# Patient Record
Sex: Female | Born: 1990 | Hispanic: Yes | Marital: Married | Smoking: Never smoker
Health system: Southern US, Community
[De-identification: ages and names within clinical notes are randomized; demographics above are authoritative.]

## PROBLEM LIST (undated history)

## (undated) DIAGNOSIS — G43909 Migraine, unspecified, not intractable, without status migrainosus: Secondary | ICD-10-CM

## (undated) DIAGNOSIS — O24419 Gestational diabetes mellitus in pregnancy, unspecified control: Secondary | ICD-10-CM

## (undated) DIAGNOSIS — E282 Polycystic ovarian syndrome: Secondary | ICD-10-CM

## (undated) HISTORY — DX: Gestational diabetes mellitus in pregnancy, unspecified control: O24.419

## (undated) HISTORY — DX: Polycystic ovarian syndrome: E28.2

## (undated) HISTORY — DX: Migraine, unspecified, not intractable, without status migrainosus: G43.909

## (undated) HISTORY — PX: NO PAST SURGERIES: SHX2092

---

## 2016-07-12 ENCOUNTER — Ambulatory Visit (INDEPENDENT_AMBULATORY_CARE_PROVIDER_SITE_OTHER): Payer: Self-pay | Admitting: Emergency Medicine

## 2016-07-12 VITALS — BP 130/85 | HR 63 | Temp 98.3°F | Resp 18 | Wt 150.4 lb

## 2016-07-12 DIAGNOSIS — N946 Dysmenorrhea, unspecified: Secondary | ICD-10-CM

## 2016-07-12 DIAGNOSIS — N938 Other specified abnormal uterine and vaginal bleeding: Secondary | ICD-10-CM

## 2016-07-12 DIAGNOSIS — N979 Female infertility, unspecified: Secondary | ICD-10-CM

## 2016-07-12 NOTE — Patient Instructions (Addendum)
IF you received an x-ray today, you will receive an invoice from [facility]. Please contact [facility] at [phone] with questions or concerns regarding your invoice.   IF you received labwork today, you will receive an invoice from [place]. Please contact [facility] at [phone] with questions or concerns regarding your invoice.   Our billing staff will not be able to assist you with questions regarding bills from these companies.  You will be contacted with the lab results as soon as they are available. The fastest way to get your results is to activate your My Chart account. Instructions are located on the last page of this paperwork. If you have not heard from [place] regarding the results in 2 weeks, please contact this office.      Metrorragia funcional (Dysfunctional Uterine Bleeding) La metrorragia funcional es una hemorragia anormal proveniente del tero. La metrorragia funcional incluye estos sntomas:  Menstruacin que se adelanta o se atrasa.  Menstruacin menos o ms abundante, o con cogulos sanguneos.  Hemorragias entre los perodos [employer].  Ausencia de una o ms menstruaciones.  Hemorragias luego de Sales promotion account executive.  Sangrado luego de la menopausia. INSTRUCCIONES PARA EL CUIDADO EN EL HOGAR Est atenta a cualquier cambio en los sntomas. Estas indicaciones pueden ayudarla con el trastorno: Comidas  Siga una dieta equilibrada. Incluya alimentos con [employer] de hierro, como hgado, carne, Oceanographer, verduras de hoja verde y [place].  Si tiene estreimiento:  Beba abundante agua.  Consuma frutas y verduras con alto contenido de agua y [place], [place] espinaca, zanahorias, frambuesas, manzanas y mango. Medicamentos  [employer] de venta libre y los recetados solamente como se lo haya indicado el mdico.  No haga cambios en los medicamentos sin hablar con el mdico.  La aspirina o los medicamentos que la  contienen pueden aumentar la hemorragia. No tome esos medicamentos:  Durante la semana previa a Tax adviser.  Durante la [employer].  Si le recetaron comprimidos de hierro, Scientist, forensic se lo haya indicado el mdico. Estos ayudan a Restaurant manager, fast food hierro que el organismo pierde debido a este trastorno. Actividad  Si debe cambiarse el apsito o el tampn ms de una vez cada 2horas:  Acustese con los pies elevados.  Colquese una compresa fra en la parte baja del abdomen.  Haga todo el reposo que pueda hasta que la hemorragia se detenga o disminuya.  No trate de Management consultant que la hemorragia se detenga y los niveles de hierro en la sangre se normalicen. Otras indicaciones  [employer], anote lo siguiente:  La fecha de comienzo de Tax adviser.  La fecha de su finalizacin.  Los [facility] que tiene una hemorragia anormal.  Los problemas que advierte.  Concurra a todas las visitas de control como se lo haya indicado el mdico. Esto es importante. SOLICITE ATENCIN MDICA SI:  Se siente dbil o que va a desvanecerse.  Tiene nuseas y vmitos.  No puede comer ni beber sin vomitar.  Tiene mareos o diarrea mientras toma los medicamentos.  Est tomando anticonceptivos u hormonas, y desea cambiar o suspender estos medicamentos. SOLICITE ATENCIN MDICA DE INMEDIATO SI:  Tiene escalofros o fiebre.  Debe cambiarse el apsito o el tampn ms de una vez por hora.  La hemorragia se vuelve ms abundante o el flujo menstrual contiene cogulos con ms frecuencia.  Siente dolor en el abdomen.  Pierde la conciencia.  Le aparece una erupcin cutnea. Esta informacin no tiene Theme park manager  el consejo del mdico. Asegrese de hacerle al mdico cualquier pregunta que tenga. Document Released: 12/30/2004 Document Revised: 12/11/2014 Document Reviewed: 06/17/2014 Elsevier Interactive Patient Education  2017 [employer].

## 2016-07-12 NOTE — Progress Notes (Signed)
Adriana Hahn 26 y.o.   Chief Complaint  Patient presents with  . Menstrual Problem    spotting, cramps     HISTORY OF PRESENT ILLNESS: This is a 26 y.o. female complaining of several years h/o irregular menses and infertility. Has seen GYNMD in [place]; had hormone replacement therapy without results. Looking for advise.  HPI   Prior to Admission medications   Not on File    No Known Allergies  Patient Active Problem List   Diagnosis Date Noted  . DUB (dysfunctional uterine bleeding) 07/12/2016  . Menstrual cramps 07/12/2016  . Infertility, female 07/12/2016    No past medical history on file.  No past surgical history on file.  Social History   Social History  . Marital status: Married    Spouse name: N/A  . Number of children: N/A  . Years of education: N/A   Occupational History  . Not on file.   Social History Main Topics  . Smoking status: Never Smoker  . Smokeless tobacco: Never Used  . Alcohol use No  . Drug use: Unknown  . Sexual activity: Not on file   Other Topics Concern  . Not on file   Social History Narrative  . No narrative on file    No family history on file.   Review of Systems  Constitutional: Negative.  Negative for chills, fever and weight loss.  HENT: Negative.   Eyes: Negative.   Respiratory: Negative.   Cardiovascular: Negative.   Gastrointestinal: Negative.  Negative for abdominal pain, diarrhea, nausea and vomiting.  Genitourinary: Negative for dysuria, flank pain, hematuria and urgency.  Musculoskeletal: Negative.   Skin: Negative.  Negative for rash.  Neurological: Negative.   Endo/Heme/Allergies: Negative.  Does not bruise/bleed easily.  All other systems reviewed and are negative.  Vitals:   07/12/16 1640  BP: 130/85  Pulse: 63  Resp: 18  Temp: 98.3 F (36.8 C)     Physical Exam  Constitutional: She appears well-developed and well-nourished.  HENT:  Head: Normocephalic and atraumatic.  Nose: Nose  normal.  Mouth/Throat: Oropharynx is clear and moist. No oropharyngeal exudate.  Eyes: Conjunctivae and EOM are normal. Pupils are equal, round, and reactive to light.  Neck: Normal range of motion. Neck supple. No JVD present. No thyromegaly present.  Cardiovascular: Normal rate and regular rhythm.   Pulmonary/Chest: Effort normal and breath sounds normal.  Abdominal: Soft. Bowel sounds are normal. She exhibits no distension. There is no tenderness.  Musculoskeletal: Normal range of motion.  Lymphadenopathy:    She has no cervical adenopathy.  Neurological: She is alert. No sensory deficit. She exhibits normal muscle tone.  Skin: Skin is warm and dry.  Psychiatric: She has a normal mood and affect. Her behavior is normal.  Vitals reviewed.    ASSESSMENT & PLAN: Tocara was seen today for menstrual problem.  Diagnoses and all orders for this visit:  DUB (dysfunctional uterine bleeding) -     Ambulatory referral to Obstetrics / Gynecology  Menstrual cramps -     Ambulatory referral to Obstetrics / Gynecology  Infertility, female -     Ambulatory referral to Obstetrics / Gynecology    Patient Instructions       IF you received an x-ray today, you will receive an invoice from [facility]. Please contact [facility]iology at [phone] with questions or concerns regarding your invoice.   IF you received labwork today, you will receive an invoice from [place]. Please contact [facility] at [phone] with questions  or concerns regarding your invoice.   Our billing staff will not be able to assist you with questions regarding bills from these companies.  You will be contacted with the lab results as soon as they are available. The fastest way to get your results is to activate your My Chart account. Instructions are located on the last page of this paperwork. If you have not heard from [place] regarding the results in 2 weeks, please contact this office.       Metrorragia funcional (Dysfunctional Uterine Bleeding) La metrorragia funcional es una hemorragia anormal proveniente del tero. La metrorragia funcional incluye estos sntomas:  Menstruacin que se adelanta o se atrasa.  Menstruacin menos o ms abundante, o con cogulos sanguneos.  Hemorragias entre los perodos [employer].  Ausencia de una o ms menstruaciones.  Hemorragias luego de Sales promotion account executive.  Sangrado luego de la menopausia. INSTRUCCIONES PARA EL CUIDADO EN EL HOGAR Est atenta a cualquier cambio en los sntomas. Estas indicaciones pueden ayudarla con el trastorno: Comidas  Siga una dieta equilibrada. Incluya alimentos con [employer] de hierro, como hgado, carne, Oceanographer, verduras de hoja verde y [place].  Si tiene estreimiento:  Beba abundante agua.  Consuma frutas y verduras con alto contenido de agua y [employer], [place] espinaca, zanahorias, frambuesas, manzanas y mango. Medicamentos  [employer] de venta libre y los recetados solamente como se lo haya indicado el mdico.  No haga cambios en los medicamentos sin hablar con el mdico.  La aspirina o los medicamentos que la contienen pueden aumentar la hemorragia. No tome esos medicamentos:  Durante la semana previa a Tax adviser.  Durante la [employer].  Si le recetaron comprimidos de hierro, Scientist, forensic se lo haya indicado el mdico. Estos ayudan a Restaurant manager, fast food hierro que el organismo pierde debido a este trastorno. Actividad  Si debe cambiarse el apsito o el tampn ms de una vez cada 2horas:  Acustese con los pies elevados.  Colquese una compresa fra en la parte baja del abdomen.  Haga todo el reposo que pueda hasta que la hemorragia se detenga o disminuya.  No trate de Management consultant que la hemorragia se detenga y los niveles de hierro en la sangre se normalicen. Otras indicaciones  [employer], anote lo siguiente:  La fecha de comienzo de Barrister's clerk.  La fecha de su finalizacin.  Los [facility] que tiene una hemorragia anormal.  Los problemas que advierte.  Concurra a todas las visitas de control como se lo haya indicado el mdico. Esto es importante. SOLICITE ATENCIN MDICA SI:  Se siente dbil o que va a desvanecerse.  Tiene nuseas y vmitos.  No puede comer ni beber sin vomitar.  Tiene mareos o diarrea mientras toma los medicamentos.  Est tomando anticonceptivos u hormonas, y desea cambiar o suspender estos medicamentos. SOLICITE ATENCIN MDICA DE INMEDIATO SI:  Tiene escalofros o fiebre.  Debe cambiarse el apsito o el tampn ms de una vez por hora.  La hemorragia se vuelve ms abundante o el flujo menstrual contiene cogulos con ms frecuencia.  Siente dolor en el abdomen.  Pierde la conciencia.  Le aparece una erupcin cutnea. Esta informacin no tiene Theme park manager el consejo del mdico. Asegrese de hacerle al mdico cualquier pregunta que tenga. Document Released: 12/30/2004 Document Revised: 12/11/2014 Document Reviewed: 06/17/2014 Elsevier Interactive Patient Education  2017 Elsevier Inc.      Edwina Barth, MD [facility]

## 2016-07-27 ENCOUNTER — Ambulatory Visit (INDEPENDENT_AMBULATORY_CARE_PROVIDER_SITE_OTHER): Payer: Self-pay | Admitting: Gynecology

## 2016-07-27 ENCOUNTER — Encounter: Payer: Self-pay | Admitting: Gynecology

## 2016-07-27 VITALS — BP 112/76 | Ht 61.0 in | Wt 152.8 lb

## 2016-07-27 DIAGNOSIS — N970 Female infertility associated with anovulation: Secondary | ICD-10-CM

## 2016-07-27 DIAGNOSIS — Z01419 Encounter for gynecological examination (general) (routine) without abnormal findings: Secondary | ICD-10-CM

## 2016-07-27 LAB — CBC WITH DIFFERENTIAL/PLATELET
BASOS ABS: 83 {cells}/uL (ref 0–200)
Basophils Relative: 1 %
EOS PCT: 10 %
Eosinophils Absolute: 830 cells/uL — ABNORMAL HIGH (ref 15–500)
HCT: 37.4 % (ref 35.0–45.0)
Hemoglobin: 12.4 g/dL (ref 11.7–15.5)
LYMPHS PCT: 33 %
Lymphs Abs: 2739 cells/uL (ref 850–3900)
MCH: 28.8 pg (ref 27.0–33.0)
MCHC: 33.2 g/dL (ref 32.0–36.0)
MCV: 87 fL (ref 80.0–100.0)
MONOS PCT: 5 %
MPV: 10.9 fL (ref 7.5–12.5)
Monocytes Absolute: 415 cells/uL (ref 200–950)
NEUTROS PCT: 51 %
Neutro Abs: 4233 cells/uL (ref 1500–7800)
PLATELETS: 311 10*3/uL (ref 140–400)
RBC: 4.3 MIL/uL (ref 3.80–5.10)
RDW: 14.5 % (ref 11.0–15.0)
WBC: 8.3 10*3/uL (ref 3.8–10.8)

## 2016-07-27 LAB — TSH: TSH: 0.71 m[IU]/L

## 2016-07-27 NOTE — Patient Instructions (Signed)
Fertilizacin in vitro  (In Vitro Fertilization) La fertilizacin in vitro (FIV) es un tipo de tecnologa aplicada a la reproduccin asistida. La FIV consiste en una serie de procedimientos para tratar la infertilidad o problemas genticos con el objeto de prestar asistencia para la concepcin de un beb. Durante la FIV, los vulos se recuperan de los ovarios y se combinan con los espermatozoides en el laboratorio para fertilizarlos. Uno o ms vulos fertilizados (embriones) se insertan en el tero a travs del cuello uterino. Las candidatas para la FIV son:  Personas que sufren infertilidad.  Las mujeres que sufren una menopausia prematura o una falla ovrica.  Las mujeres a las que se les han extirpado ambos ovarios. En este caso, deber usarse una donante de vulos.  Mujeres que tienen daadas u obstruidas las trompas de [place] No hay lmite de edad para la FIV, pero no se recomienda para las mujeres postmenopusicas. La edad ideal para este procedimiento es de 35 aos o menos. A las mujeres de ms de 41 aos generalmente se les aconseja utilizar una donante de vulos durante la FIV para aumentar las probabilidades de xito. INFORME A SU MDICO:   Cualquier alergia que tenga.  Todos los medicamentos que utiliza, incluyendo vitaminas, hierbas, gotas oftlmicas, cremas y medicamentos de venta libre.  Problemas previos que usted o los miembros de su familia hayan tenido con el uso de anestsicos.  Todo problema mdico o gentico que usted o los miembros de su familia hayan sufrido.  Enfermedades de la sangre.  Cirugas previas.  Embarazos previos.  Padecimientos mdicos.  Excesos en el consumo de alcohol o de tabaco.  Historia de consumo de drogas. RIESGOS Y COMPLICACIONES  Generalmente, el procedimiento de FIV es un procedimiento seguro. Sin embargo, como en cualquier procedimiento, pueden surgir complicaciones. Las complicaciones posibles son:  Sangrado o  infeccin.  Problemas con la anestesia.  Cogulos sanguneos.  El procedimiento no tiene xito.  Tener gemelos o embarazos mltiples  Aumento del riesgo de parto prematuro. ANTES DEL PROCEDIMIENTO  Antes de comenzar el ciclo de FIV, usted y el donante sern sometidos a estudios para asegurarse de que la FIV es la mejor opcin. Algunas personas pueden no beneficiarse con este tipo de tecnologa para la asistencia reproductiva.   Usted y el donante tendrn que proporcionar una historia clnica completa y la historia clnica de sus familiares.  Usted y el donante sern sometidos a un examen fsico.  Usted y el donante podrn necesitar realizarse anlisis de sangre para detectar enfermedades infecciosas, incluyendo el VIH.  Podrn solicitarle otros estudios, por ejemplo:  Estudio de los ovarios para determinar la calidad y la cantidad de vulos.  Estudios hormonales y de ovulacin.  Un examen del tero. Este se realiza por medio de un tipo de ecografa, despus de inyectar un lquido en el tero a travs del cuello uterino (ecohisterografa o utilizando un tubo delgado y flexible, con una pequea luz y cmara en un extremo (histeroscopio).  La esperma del donante ser tomada y analizada para ver si es normal, hay cantidad suficiente para fertilizar el vulo y que actan normalmente despus de las relaciones sexuales (examen postcoital ). PROCEDIMIENTO  La FIV consiste en varios pasos. Estos procedimientos pueden realizarse en el consultorio mdico o en una clnica. Un ciclo de FIV puede llevar alrededor de 2 semanas, y podr requerirse ms de un ciclo. Los pasos de la FIV son:  Estimulacin ovrica. Si se utilizan sus vulos durante la FIV, al comienzo del ciclo   comenzar un tratamiento con hormonas artificiales (sintticas. Estas hormonas estimulan los ovarios para producir mltiples vulos, a diferencia del vulo nico que normalmente se desarrolla cada mes. Es necesario obtener  mltiples vulos debido a que algunos de ellos no se fertilizarn o no se desarrollarn normalmente despus de la fertilizacin.  Extraccin de vulos. Utilizando imgenes radiogrficas como gua, el medico insertar una aguja fina a travs de la vagina y la dirigir a los ovarios y sacos (folculos) que contienen los vulos La aguja se conecta a un dispositivo de succin, que retira los vulos y el lquido de cada folculo, uno por vez. El procedimiento se repite para el otro ovario.  Inseminacin y fertilizacin. El esperma se une a los ovarios (inseminacin) y se almacena en una cmara que tiene un ambiente controlado. Generalmente el esperma ingresa (fertiliza) el vulo unas horas despus de la inseminacin.  Transferencia embrionaria. El mdico colocar los embriones en su tero usando un tubo delgado (catter) que contiene los embriones. El catter se inserta en la vagina a travs del cuello uterino, y de all al tero. La transferencia de vulos generalmente ocurre entre 2 a 6 das luego de la extraccin de los vulos. Si hay xito, el embrin se adherir (implante) en la superficie interna del tero alrededor de 6 a 10 das luego de la extraccin de los vulos. Si se implanta el embrin en la membrana que cubre el tero y se desarrolla, resulta en un embarazo. DESPUS DEL PROCEDIMIENTO   Tendr que permanecer acostada durante una hora o ms, antes de volver a su casa.  Ser necesario que siga con la terapia hormonal por 3 meses aproximadamente, o segn lo que le indique su mdico.  Puede retomar su dieta y las actividades habituales. Esta informacin no tiene como fin reemplazar el consejo del mdico. Asegrese de hacerle al mdico cualquier pregunta que tenga. Document Released: 11/22/2012 Elsevier Interactive Patient Education  2017 Elsevier Inc.  

## 2016-07-27 NOTE — Progress Notes (Signed)
Patient ID: Adriana Hahn, female   DOB: [birth year], 26 y.o.   MRN: [id number]     Adriana Hahn 26-Jun-1990 [id number]   History:    26 y.o.  for annual gyn exam  who is a new patient to the practice. She is a gravida 0 para 0 with complaining of primary infertility and skipping menstrual cycles attributed chronic anovulation. Patient stated that last year she was evaluated in [place] had an HSG and told her tubes were blocked and some form of office procedure was performed and she was informed that her tubes were open she was placed on clomiphene citrate for which she took for 8 months 1 tablet a 50 mg for 5 days a month and did not conceive. She stated that she only did the ovulation predictor kit for 2 out of those months. She did not have a menstrual cycle in January and February and then in March she bled from the third through the 15th. She denies any nipple discharge or any unusual headaches or blurry vision but she does suffer from migraine headaches. She denies any past history of any STDs or pelvic surgery and last Pap smear was over 2 years ago in [place] was reportedly normal. Her husband is 74 years of age and hasn't 71 year old from a different partner. Patient stated for many years in the past her gynecologist in [place] and given her Provera to take every 30 days and she did not have a spontaneous menses.   Past medical history,surgical history, family history and social history were all reviewed and documented in the EPIC chart.  Gynecologic History Patient's last menstrual period was 07/06/2016. Contraception: none Last Pap:  over 2 years ago. Results were: normal Last mammogram:  not indicated. Results were: not indicated   Obstetric History OB History  Gravida Para Term Preterm AB Living  0 0 0 0 0 0  SAB TAB Ectopic Multiple Live Births  0 0 0 0 0         ROS: A ROS was performed and pertinent positives and negatives are included in the history.  GENERAL: No fevers or chills.  HEENT: No change in vision, no earache, sore throat or sinus congestion. NECK: No pain or stiffness. CARDIOVASCULAR: No chest pain or pressure. No palpitations. PULMONARY: No shortness of breath, cough or wheeze. GASTROINTESTINAL: No abdominal pain, nausea, vomiting or diarrhea, melena or bright red blood per rectum. GENITOURINARY: No urinary frequency, urgency, hesitancy or dysuria. MUSCULOSKELETAL: No joint or muscle pain, no back pain, no recent trauma. DERMATOLOGIC: No rash, no itching, no lesions. ENDOCRINE: No polyuria, polydipsia, no heat or cold intolerance. No recent change in weight. HEMATOLOGICAL: No anemia or easy bruising or bleeding. NEUROLOGIC: No headache, seizures, numbness, tingling or weakness. PSYCHIATRIC: No depression, no loss of interest in normal activity or change in sleep pattern.     Exam: chaperone present  BP 112/76   Ht 5' 1"  (1.549 m)   Wt 152 lb 12.8 oz (69.3 kg)   LMP 07/06/2016   BMI 28.87 kg/m   Body mass index is 28.87 kg/m.  General appearance : Well developed well nourished female. No acute distress HEENT: Eyes: no retinal hemorrhage or exudates,  Neck supple, trachea midline, no carotid bruits, no thyroidmegaly Lungs: Clear to auscultation, no rhonchi or wheezes, or rib retractions  Heart: Regular rate and rhythm, no murmurs or gallops Breast:Examined in sitting and supine position were symmetrical in appearance, no palpable masses or tenderness,  no skin retraction, no nipple  inversion, no nipple discharge, no skin discoloration, no axillary or supraclavicular lymphadenopathy Abdomen: no palpable masses or tenderness, no rebound or guarding Extremities: no edema or skin discoloration or tenderness  Pelvic:  Bartholin, Urethra, Skene Glands: Within normal limits             Vagina: No gross lesions or discharge  Cervix: No gross lesions or discharge  Uterus   anteverted, normal size, shape and consistency, non-tender and mobile  Adnexa  Without  masses or tenderness  Anus and perineum  normal   Rectovaginal  normal sphincter tone without palpated masses or tenderness             HeHad indicated     Assessment/Plan:  26 y.o. female for annual exam with history of primary infertility, chronic anovulation on clear of workup done in [place] in for this reason with the start of her next cycle she will call the office we'll schedule an HSG to determine tubal patency before we moved further. She'll be prescribed Vibramycin 100 mg to take twice a day for 3 days starting the day before the HSG. Patient will be instructed to take a prenatal vitamins daily. I've given a prescription for Provera 10 mg take 1 by mouth daily for 10 days if she does not have a menses by the fifth of next month before she calls to schedule the HSG. Also going to check a TSH and prolactin today. If her tubes are indeed blocked we discussed possible referral to reproductive endocrinologist for in vitro fertilization for which surgery information was provided in [place].   Terrance Mass MD, 3:02 PM 07/27/2016

## 2016-07-28 LAB — PAP IG W/ RFLX HPV ASCU

## 2016-07-28 LAB — PROLACTIN: Prolactin: 6.3 ng/mL

## 2016-08-02 ENCOUNTER — Other Ambulatory Visit: Payer: Self-pay | Admitting: Gynecology

## 2016-08-02 DIAGNOSIS — R898 Other abnormal findings in specimens from other organs, systems and tissues: Secondary | ICD-10-CM

## 2016-08-02 DIAGNOSIS — D721 Eosinophilia: Principal | ICD-10-CM

## 2016-08-10 ENCOUNTER — Telehealth: Payer: Self-pay | Admitting: *Deleted

## 2016-08-10 NOTE — Telephone Encounter (Signed)
Pharmacy called regarding written Rx from OV 07/27/16. Pharmacist was unable to read the directions for provera 10 mg. I re-read what the note said which was "Provera 10 mg take 1 by mouth daily for 10 days if she does not have a menses by the fifth of next month"

## 2016-08-18 ENCOUNTER — Encounter: Payer: Self-pay | Admitting: Gynecology

## 2016-08-23 ENCOUNTER — Telehealth: Payer: Self-pay | Admitting: *Deleted

## 2016-08-23 DIAGNOSIS — N970 Female infertility associated with anovulation: Secondary | ICD-10-CM

## 2016-08-23 NOTE — Telephone Encounter (Signed)
-----   Message from Jerilynn Mageslaudia Shaffer sent at 08/23/2016  9:11 AM EDT ----- Regarding: nurse call Patient needs HSG. LMP 08-21-16 she prefers afternoon appointments. Thx

## 2016-08-23 NOTE — Telephone Encounter (Signed)
I called women's to schedule and based on pt LMP she will need to wait until next cycle. Day 10 would be a holiday and radiology department is closed for procedures. Debarah CrapeClaudia will relay to patient.

## 2016-09-06 ENCOUNTER — Other Ambulatory Visit: Payer: Self-pay | Admitting: Gynecology

## 2016-09-29 ENCOUNTER — Telehealth: Payer: Self-pay | Admitting: *Deleted

## 2016-09-29 DIAGNOSIS — N970 Female infertility associated with anovulation: Secondary | ICD-10-CM

## 2016-09-29 MED ORDER — DOXYCYCLINE HYCLATE 100 MG PO CAPS: ORAL_CAPSULE | ORAL | 0 refills | Status: DC

## 2016-09-29 NOTE — Telephone Encounter (Signed)
Appointment on 10/04/16 @ 2:00pm/ check [facility] at 1:45pm  [facility] $[facility] same day. Rx will be sent for Vibramycin 100 twice daily starting day before procedure. Will route to claudia to relay.

## 2016-09-29 NOTE — Telephone Encounter (Signed)
-----   Message from Jerilynn Mageslaudia Shaffer sent at 09/28/2016 12:39 PM EDT ----- Regarding: HSG Please scheduled HSG her LMP 09-26-16 and she prefers afternoon. She is self pay and I already informed her of the cost. Thx

## 2016-09-29 NOTE — Telephone Encounter (Signed)
Detailed message left

## 2016-10-04 ENCOUNTER — Ambulatory Visit (HOSPITAL_COMMUNITY): Admission: RE | Admit: 2016-10-04 | Payer: Self-pay | Source: Ambulatory Visit

## 2016-10-05 ENCOUNTER — Encounter (HOSPITAL_COMMUNITY): Payer: Self-pay | Admitting: Radiology

## 2016-10-05 ENCOUNTER — Ambulatory Visit (HOSPITAL_COMMUNITY)
Admission: RE | Admit: 2016-10-05 | Discharge: 2016-10-05 | Disposition: A | Discharge status: Home / Self Care | Payer: Self-pay | Source: Ambulatory Visit | Attending: Gynecology | Admitting: Gynecology

## 2016-10-05 DIAGNOSIS — N970 Female infertility associated with anovulation: Secondary | ICD-10-CM | POA: Insufficient documentation

## 2016-10-05 MED ORDER — IOPAMIDOL (ISOVUE-300) INJECTION 61%
30.0000 mL | Freq: Once | INTRAVENOUS | Status: AC | PRN
Start: 2016-10-05 — End: 2016-10-05
  Administered 2016-10-05: 5 mL

## 2016-10-14 ENCOUNTER — Encounter: Payer: Self-pay | Admitting: Gynecology

## 2016-10-14 ENCOUNTER — Ambulatory Visit (INDEPENDENT_AMBULATORY_CARE_PROVIDER_SITE_OTHER): Payer: Self-pay | Admitting: Gynecology

## 2016-10-14 VITALS — BP 138/80

## 2016-10-14 DIAGNOSIS — N970 Female infertility associated with anovulation: Secondary | ICD-10-CM

## 2016-10-14 MED ORDER — LETROZOLE 2.5 MG PO TABS: ORAL_TABLET | ORAL | 2 refills | Status: DC

## 2016-10-14 NOTE — Patient Instructions (Addendum)
IInfertilidad (Infertility) La infertilidad es no poder Research scientist (physical sciences)lograr el embarazo (concebir) despus de un ao de Pharmacologistmantener relaciones sexuales regularmente sin usar ningn mtodo de control de la natalidad. La infertilidad tambin puede significar que una mujer no puede llevar el embarazo a trmino. Tanto hombres como mujeres pueden tener problemas de fertilidad. CULES SON LAS CAUSAS DE LA INFERTILIDAD? Cules son las causas de la infertilidad en las mujeres? Hay muchas causas posibles de infertilidad en las mujeres. En algunas mujeres, no hay ninguna causa aparente de infertilidad (infertilidad idioptica). La infertilidad tambin puede estar vinculada a ms de [place] causa. Los problemas de infertilidad en las mujeres pueden deberse a problemas en el ciclo menstrual o los rganos reproductivos, ciertas afecciones mdicas y factores relacionados con la edad y el estilo de vida.  Los problemas en el ciclo menstrual pueden interferir con los ovarios que producen los vulos (ovulacin). Esto puede causar dificultad para quedar embarazada e incluye tener un ciclo menstrual muy largo, muy corto o irregular.  Los problemas relacionados con los rganos reproductivos incluyen lo siguiente: ? Un cuello de tero anormalmente angosto o que no permanece cerrado durante el [place]. ? Una obstruccin en las trompas de [place]. ? Un tero de forma anormal. ? Fibromas uterinos. Estos son masas de tejido (tumores) que pueden Solicitordesarrollarse en el tero.  Las afecciones mdicas que pueden afectar la fertilidad en las mujeres incluyen lo siguiente: ? Sndrome de ovario poliqustico (SOP). Este es un trastorno hormonal que produce pequeos quistes en los ovarios y es la causa ms frecuente de infertilidad en las mujeres. ? Endometriosis. Es una enfermedad en la que el tejido que recubre el tero (endometrio) crece fuera de su ubicacin normal. ? Insuficiencia ovrica primaria. Esto es cuando los ovarios dejan de producir  vulos y hormonas antes de los 40aos de [place]. ? Enfermedades de transmisin sexual (ETS), como la clamidia o la gonorrea. Estas infecciones pueden provocar fibrosis en las trompas de Falopio, lo que disminuye la probabilidad de que los vulos lleguen al tero. ? Trastornos autoinmunitarios. Estas son afecciones en las que el sistema inmunitario ataca las clulas normales y saludables. ? Desequilibrios hormonales.  Otros factores incluyen los siguientes: ? La edad. La fertilidad en las mujeres declina con la edad, especialmente despus de los 35aos. ? Tener bajo peso o sobrepeso. ? Beber alcohol en exceso. ? Consumir drogas. ? Hacer ejercicio en exceso. ? La exposicin a toxinas ambientales, como la radiacin, los plaguicidas y ciertos qumicos. Cules son las causas de la infertilidad en los hombres? Hay muchas causas de infertilidad en los hombres. La infertilidad puede estar vinculada a ms de Dean Foods Companyuna causa. Los problemas de infertilidad en los hombres pueden deberse a problemas con los espermatozoides o los rganos reproductivos, ciertas afecciones mdicas y factores relacionados con la edad y el estilo de vida. Algunos hombres tienen infertilidad idioptica.  Problemas con los espermatozoides. La infertilidad puede ser consecuencia de un problema para producir lo siguiente: ? Suficientes espermatozoides (bajo recuento de espermatozoides). ? Suficientes espermatozoides con forma normal (morfologa de los espermatozoides). ? Espermatozoides que puedan llegar al vulo (motilidad deficiente).  Las causas de la infertilidad tambin incluyen las siguientes: ? Un problema con las hormonas. ? Venas agrandadas (varicocele), quistes (espermatocele) o tumores en los testculos. ? Disfuncin sexual. ? Una lesin en los testculos. ? Un defecto de nacimiento, como no tener los conductos que transportan los espermatozoides (conductos deferentes).  Algunas de las afecciones mdicas que pueden  afectar la fertilidad en los hombres son  las siguientes: ? Diabetes. ? Facilities manager, como la radioterapia o la quimioterapia. ? El sndrome de Klinefelter. Este es un trastorno gentico hereditario. ? Problemas de tiroides, como una tiroides hipoactiva o hiperactiva. ? Fibrosis qustica. ? Enfermedades de transmisin sexual.  Otros factores incluyen los siguientes: ? La edad. La fertilidad en los hombres declina con la edad. ? Beber alcohol en exceso. ? Consumir drogas. ? La exposicin a toxinas ambientales, como la radiacin, los plaguicidas y el plomo. CULES SON LOS SNTOMAS DE LA INFERTILIDAD? El nico signo de infertilidad es no poder Retail buyer despus de un ao de Theatre manager relaciones sexuales regularmente sin usar ningn mtodo de control de la natalidad. CMO SE DIAGNOSTICA LA INFERTILIDAD? Para recibir un diagnstico de infertilidad, ambos integrantes de la pareja se harn a un examen fsico. Tambin se analizar en detalle la historia clnica y sexual de ambos. Si no hay una razn evidente de infertilidad, se harn estudios adicionales. Qu estudios se harn las mujeres? Las mujeres primero pueden hacerse estudios para controlar si ovulan todos los meses. Estos estudios pueden incluir lo siguiente:  Anlisis de sangre para [employer] niveles hormonales.  Una ecografa de los ovarios. Este estudio detecta posibles problemas en los ovarios.  Toma de [place] de tejido que recubre el tero para examinarla en el microscopio (biopsia de endometrio). Las mujeres que ovulan pueden realizarse estudios adicionales. Estos pueden incluir los siguientes:  Histerosalpingografa. ? Es una radiografa de las trompas de [place] y el tero despus de Tour manager un tipo especfico de sustancia de [place]. ? Esta prueba puede mostrar la forma del tero y si las trompas de [place] estn [place].  Laparoscopia. ? En [employer], se [place] un tubo que  emite luz (laparoscopio) para Education officer, environmental en las trompas de Falopio y otros rganos femeninos.  Ecografa transvaginal. ? Este es un estudio de diagnstico por imgenes que determina si hay anomalas en el tero y los ovarios. ? El mdico puede utilizar este estudio para contar la cantidad de [employer] ovarios.  Histeroscopa. ? En [employer], se [place] un tubo que emite luz para examinar el cuello y el interior del tero. ? Se realiza para Airline pilot en el interior del tero. Qu estudios se harn los hombres? Los estudios para Office manager infertilidad en los hombres incluyen los siguientes:  Anlisis de semen para controlar el recuento, la morfologa y la motilidad de los espermatozoides.  Anlisis de sangre para [employer].  Toma de [place] de tejido del interior de un testculo (biopsia). La muestra se examina en el microscopio.  Anlisis de sangre para Airline pilot genticas (pruebas genticas). CUL ES EL TRATAMIENTO PARA LA INFERTILIDAD EN LAS MUJERES? El tratamiento depende de la causa de la infertilidad. En la [employer], la infertilidad en las mujeres se trata con medicamentos o [place].  Las mujeres pueden tomar medicamentos para: ? Biomedical scientist de ovulacin. ? [employer], como el SOP.  Se puede realizar Clementeen Hoof para lo siguiente: ? Reparar daos en los ovarios, las trompas de [place], el cuello del tero o el tero. ? Extraer tumores del tero. ? Extraer tejido cicatricial del tero, la pelvis u otros rganos femeninos. CUL ES EL TRATAMIENTO PARA LA INFERTILIDAD EN LOS [place]? El tratamiento depende de la causa de la infertilidad. En la [employer], la infertilidad en los hombres se trata con medicamentos o [place].  Los hombres pueden tomar  medicamentos para: ? Corregir problemas hormonales. ? Tratar otras enfermedades. ? Tratar la disfuncin sexual.  Se  puede realizar una ciruga para lo siguiente: ? Eliminar obstrucciones en el tracto reproductivo. ? Corregir otros problemas estructurales del tracto reproductivo. QU SON LAS TCNICAS DE REPRODUCCIN ASISTIDA? Las tcnicas de reproduccin asistida (TRA) se refieren a todos los tratamientos y procedimientos que unen vulos y espermatozoides fuera del cuerpo para ayudar a una pareja a concebir. Las TRA se suelen combinar con medicamentos para la fertilidad que estimulan la ovulacin. En algunos casos, las TRA se llevan a cabo con vulos extrados del cuerpo de otra mujer (vulos de donante) o con vulos previamente fertilizados y congelados (embriones). Hay diferentes tipos de TRA. Estos incluyen los siguientes:  Inseminacin intrauterina (IIU). ? En este procedimiento, los espermatozoides se colocan directamente en el tero de la mujer con un tubo largo y delgado. ? Esta tcnica puede ser la ms efectiva para la infertilidad causada por problemas con los espermatozoides, incluidos el bajo recuento y la baja motilidad. ? Se puede utilizar en combinacin con medicamentos para la fertilidad.  Fertilizacin in vitro (FIV). ? Por lo general, se utiliza esta tcnica cuando las trompas de [place] de la mujer estn obstruidas o si el hombre tiene un bajo recuento de espermatozoides. ? Los medicamentos para la infertilidad estimulan los ovarios para que produzcan varios vulos. Cuando estn maduros, estos vulos se extraen del cuerpo y se unen a los espermatozoides para ser fertilizados. ? Luego los vulos fertilizados se colocan en el tero de la mujer. Esta informacin no tiene como fin reemplazar el consejo del mdico. Asegrese de hacerle al mdico cualquier pregunta que tenga. Document Released: 04/11/2007 Document Revised: 04/12/2014 Document Reviewed: 12/05/2013 Elsevier Interactive Patient Education  2018 Elsevier Inc.  

## 2016-10-14 NOTE — Progress Notes (Signed)
   Patient is a 26 year old gravida 0 who was seen for the first time as a new patient in the office on April 24 of this year for annual exam. Patient states that time she was skip her cycles for several months. Patient stated that last year she was evaluated in [place] had an HSG and told her tubes were blocked and some form of office procedure was performed and she was informed that her tubes were open she was placed on clomiphene citrate for several months and did not conceive. She stated that she use the ovulation predictor kit 2 out of the 3 months. We had done a TSH and prolactin here in the office which was normal.Her husband is 68 years of age and hasn't 92 year old from a different partner. Patient stated for many years in the past her gynecologist in [place] and given her Provera to take every 30 days and she did not have a spontaneous menses. She had been scheduled to have an HSG and return to the office for consultation. She did state that her husband has had a semen analysis in [place] in January this year. Her recent HSG demonstrated the following:  FINDINGS: Endometrial Cavity: Normal appearance. No signs of Mullerian duct anomaly or other significant abnormality.  Right Fallopian Tube: Normal appearance. Free intraperitoneal spill of contrast is demonstrated.  Left Fallopian Tube: Normal appearance. Free intraperitoneal spill of contrast is demonstrated.  Other:  None.  IMPRESSION: Normal study. Both fallopian tubes are patent.  She reports her last menstrual cycle was June 21 of this year. We had a discussion of given her a trial of letrozole 2.5 mg from day 3-7 of her cycle with the timing of intercourse by using the ovulation predictor kit from day 10 through 16. If she does not conceive in 3 months or return back to the office for further evaluation or possible referral to reproductive endocrinologist but first I would recommend that her husband and have first a semen analysis  here in the [place]. We do not have the report from [place]. She is agreeable to this. The risks benefits and pros and cons of ovulation induction medication were discussed to include hyperstimulation and multiple gestation. Literature information was provided in [place]. I've recommended also that she begin taking a prenatal vitamin daily.

## 2017-05-03 ENCOUNTER — Encounter: Payer: Self-pay | Admitting: Obstetrics & Gynecology

## 2017-05-03 ENCOUNTER — Ambulatory Visit (INDEPENDENT_AMBULATORY_CARE_PROVIDER_SITE_OTHER): Payer: Self-pay | Admitting: Obstetrics & Gynecology

## 2017-05-03 VITALS — BP 118/78

## 2017-05-03 DIAGNOSIS — N644 Mastodynia: Secondary | ICD-10-CM

## 2017-05-03 DIAGNOSIS — N970 Female infertility associated with anovulation: Secondary | ICD-10-CM

## 2017-05-03 MED ORDER — CLOMIPHENE CITRATE 50 MG PO TABS: 50.0000 mg | ORAL_TABLET | Freq: Every day | ORAL | 0 refills | Status: DC

## 2017-05-03 NOTE — Progress Notes (Signed)
    Adriana Hahn 15-Jan-1991 [id number]        27 y.o.  G0 Married.  Presents with husband.   RP: Irregular periods with bilateral breast tenderness x 2 months  HPI: H/O Anovulatory infertility.  Tried Clomid x 3 cycles last year, last cycle was ovulatory per patient.  HSG normal 10/2016 with tubes patent bilaterally and normal uterine cavity.  Per husband, [place]perm [place] normal.  LMP 04/15/2017.  Last 2 periods were regular at 1 month interval, but very light flow x 4 days.  Patient reports doing an Ovulatory test that was positive for many consecutive days in January.  Complains of bilateral breast tenderness that has remained the same for the last 2 months.  In the past, the tenderness was resolving after her periods.  No breast lump or nipple d/c. Some pelvic discomfort at times, but no current pain.  No pain with IC.  No abnormal vaginal d/c.  Urine and BMs wnl.   OB History  Gravida Para Term Preterm AB Living  0 0 0 0 0 0  SAB TAB Ectopic Multiple Live Births  0 0 0 0 0        Past medical history,surgical history, problem list, medications, allergies, family history and social history were all reviewed and documented in the EPIC chart.   Directed ROS with pertinent positives and negatives documented in the history of present illness/assessment and plan.  Exam:  Vitals:   05/03/17 1109  BP: 118/78   General appearance:  Normal  Breasts:  Right and Left with no nodule or mass.  Bilaterally tender centrally with mildly fibrocystic tissue felt.  Normal bilateral nipples.  No axillary LN felt bilaterally.  Abdomen:  Soft, NT, not distended  Gynecologic exam:  Vulva normal.  Bimanual exam:  Uterus RV, normal volume, mobile, NT.  No adnexal mass felt.   Assessment/Plan:  27 y.o. G0P0000   1. Breast pain in female Normal bilateral breast exam.  Bilateral breast tenderness probably associated with the combination of anovulatory cycles and mild fibrocystic breasts.   Patient reassured.  Starting stimulation with Clomiphen, which will change the balance in estrogen/progesterone and will observe the impact on the breast symptoms.  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  Patient did ovulatory tests and the results have been inconsistent.  Probably not ovulating every month.  History of primary anovulatory infertility which responded to Clomid in the past.  Had tried Clomid for 3 months and the last month was ovulatory per patient.  Decision to re-start on Clomid-5 mg/tab. 1 tablet daily from day 3 to 7 of the next cycle.  Patient will then come for an ultrasound follicle study around day 10 of the period.  Will give Ovidrel injection.  Patient and husband will decide if prefer natural intercourse after that or intrauterine insemination with sperm wash. - US Transvaginal Non-OB; Future  Other orders - clomiPHENE (CLOMID) 50 MG tablet; Take 1 tablet (50 mg total) by mouth daily. Take 1 tablet daily from day 3 to day 7 of the cycle.  Counseling on above issues >50% x 25 minutes.  Genia DelMarie-Lyne Terran Hollenkamp MD, 11:15 AM 05/03/2017

## 2017-05-03 NOTE — Patient Instructions (Signed)
1. Breast pain in female Normal bilateral breast exam.  Bilateral breast tenderness probably associated with the combination of anovulatory cycles and mild fibrocystic breasts.  Patient reassured.  Starting stimulation with Clomiphen, which will change the balance in estrogen/progesterone and will observe the impact on the breast symptoms.  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  Patient did ovulatory tests and the results have been inconsistent.  Probably not ovulating every month.  History of primary anovulatory infertility which responded to Clomid in the past.  Had tried Clomid for 3 months and the last month was ovulatory per patient.  Decision to re-start on Clomid-5 mg/tab. 1 tablet daily from day 3 to 7 of the next cycle.  Patient will then come for an ultrasound follicle study around day 10 of the period.  Will give Ovidrel injection.  Patient and husband will decide if prefer natural intercourse after that or intrauterine insemination with sperm wash. - US Transvaginal Non-OB; Future  Other orders - clomiPHENE (CLOMID) 50 MG tablet; Take 1 tablet (50 mg total) by mouth daily. Take 1 tablet daily from day 3 to day 7 of the cycle.  Aundrea, fue un placer conocerle hoy!  Voy a verle de nuevo con un Ultrasonido el proximo mes.

## 2017-05-16 ENCOUNTER — Ambulatory Visit: Payer: Self-pay | Admitting: Obstetrics & Gynecology

## 2017-05-16 ENCOUNTER — Other Ambulatory Visit: Payer: Self-pay

## 2017-05-31 ENCOUNTER — Telehealth: Payer: Self-pay | Admitting: *Deleted

## 2017-05-31 MED ORDER — CHORIOGONADOTROPIN ALFA 250 MCG/0.5ML ~~LOC~~ INJ: 250.0000 ug | INJECTION | Freq: Once | SUBCUTANEOUS | 0 refills | Status: AC

## 2017-05-31 NOTE — Telephone Encounter (Signed)
Pt called Started period 05/28/17. Will do Follicle study per Dr Sharol RousselLavoie's note around D10 of cycle. Therefore Debarah CrapeClaudia will call and schedule on March 4th.  Ovidrel will be sent to the patient's pharmacy. KW CMA

## 2017-05-31 NOTE — Telephone Encounter (Signed)
Rx called in since did not go to pharmacy electronically. KW CMA

## 2017-06-06 ENCOUNTER — Encounter: Payer: Self-pay | Admitting: Obstetrics & Gynecology

## 2017-06-06 ENCOUNTER — Ambulatory Visit (INDEPENDENT_AMBULATORY_CARE_PROVIDER_SITE_OTHER): Payer: Self-pay

## 2017-06-06 ENCOUNTER — Ambulatory Visit (INDEPENDENT_AMBULATORY_CARE_PROVIDER_SITE_OTHER): Payer: Self-pay | Admitting: Obstetrics & Gynecology

## 2017-06-06 VITALS — BP 116/78

## 2017-06-06 DIAGNOSIS — N970 Female infertility associated with anovulation: Secondary | ICD-10-CM

## 2017-06-06 NOTE — Progress Notes (Signed)
    Adriana CheshireFlor Hahn [birth year] [id number]        27 y.o.  G0P0000   Adriana Hahn: Primary anovulatory infertility for pelvic ultrasound/follicle study  HPI: Cycle on Clomid 50 mg/tab. 1 tablet daily from day third 2/7 of the cycle.  Last menstrual period normal starting on May 27, 2017.  On day 10 of the cycle today.   OB History  Gravida Para Term Preterm AB Living  0 0 0 0 0 0  SAB TAB Ectopic Multiple Live Births  0 0 0 0 0        Past medical history,surgical history, problem list, medications, allergies, family history and social history were all reviewed and documented in the EPIC chart.   Directed ROS with pertinent positives and negatives documented in the history of present illness/assessment and plan.  Exam:  Vitals:   06/06/17 1633  BP: 116/78   General appearance:  Normal  Pelvic US today: T/V images.  Uterus anteverted and homogeneous measuring 6.43 x 5.47 x 3.9 cm.  Endometrial lining tri-layered normal at 8.6 mm.  Right ovary with 25 follicles all less than 10 mm.  Left ovary with 5 follicles are less than 10 mm.  No apparent mass in the right or left adnexa.   Assessment/Plan:  27 y.o. G0P0000   1. Primary anovulatory infertility Day #10 of a Clomid 50 mg cycle.  Multiple small follicles, but no dominant follicle yet.  Will repeat ultrasound follicle study in 3 days on day 13 of the cycle.  If no dominant follicle by then, will plan on doubling the Clomid dosage next cycle.  - US Transvaginal Non-OB; Future  Counseling on above issues more than 50% for 15 minutes.  Genia DelMarie-Lyne Mahrukh Seguin MD, 4:37 PM 06/06/2017

## 2017-06-06 NOTE — Patient Instructions (Signed)
1. Primary anovulatory infertility Day #10 of a Clomid 50 mg cycle.  Multiple small follicles, but no dominant follicle yet.  Will repeat ultrasound follicle study in 3 days on day 13 of the cycle.  If no dominant follicle by then, will plan on doubling the Clomid dosage next cycle.  - US Transvaginal Non-OB; Future  Chioma, fue un placer verle hoy!

## 2017-06-09 ENCOUNTER — Ambulatory Visit (INDEPENDENT_AMBULATORY_CARE_PROVIDER_SITE_OTHER): Payer: Self-pay | Admitting: Obstetrics & Gynecology

## 2017-06-09 ENCOUNTER — Ambulatory Visit (INDEPENDENT_AMBULATORY_CARE_PROVIDER_SITE_OTHER): Payer: Self-pay

## 2017-06-09 ENCOUNTER — Encounter: Payer: Self-pay | Admitting: Obstetrics & Gynecology

## 2017-06-09 VITALS — BP 120/80

## 2017-06-09 DIAGNOSIS — N970 Female infertility associated with anovulation: Secondary | ICD-10-CM

## 2017-06-09 MED ORDER — CLOMIPHENE CITRATE 50 MG PO TABS: 100.0000 mg | ORAL_TABLET | Freq: Every day | ORAL | 0 refills | Status: AC

## 2017-06-09 NOTE — Progress Notes (Signed)
    Adriana Hahn 1990-06-23 [id number]        27 y.o.  G0P0000  Married   RP: US follicle study  HPI: Day # 13 of cycle with Clomid 50 mg day # 3-7th.     OB History  Gravida Para Term Preterm AB Living  0 0 0 0 0 0  SAB TAB Ectopic Multiple Live Births  0 0 0 0 0        Past medical history,surgical history, problem list, medications, allergies, family history and social history were all reviewed and documented in the EPIC chart.   Directed ROS with pertinent positives and negatives documented in the history of present illness/assessment and plan.  Exam:  Vitals:   06/09/17 1602  BP: 120/80   General appearance:  Normal  Pelvic US today: T/V images.  Retroverted uterus with tri-layered endometrium.  Endometrial lining measures 12.3 mm.  Right ovary with 31 follicles less or equal to 10 mm.  Left ovary with 13 follicles less or equal to 10 mm.  Small amount of fluid in the posterior cul-de-sac measuring 2.0 x 1.6 cm.   Assessment/Plan:  27 y.o. G0P0000   1. Primary anovulatory infertility No dominant follicle.  Probably anovulatory cycle.  Will increase Clomid to 100 mg day 3-7th next cycle.  If no spontaneous period, will take Provera after a neg HPT.  Clomid 100 mg #10 prescription sent to pharmacy.  Counseling on treatment to overcome anovulation.  Will start with increasing Clomid, but if not successful, may try Femara and if still no success, will refer to Fertility specialist for Hyperstimulation/IVF.  Other orders - clomiPHENE (CLOMID) 50 MG tablet; Take 2 tablets (100 mg total) by mouth daily for 5 days. From day 3 to day 7 of the cycle.  Counseling on above issues >50% x 15 minutes.  Adriana DelMarie-Lyne Rainn Bullinger MD, 4:23 PM 06/09/2017

## 2017-06-09 NOTE — Patient Instructions (Signed)
1. Primary anovulatory infertility No dominant follicle.  Probably anovulatory cycle.  Will increase Clomid to 100 mg day 3-7th next cycle.  If no spontaneous period, will take Provera after a neg HPT.  Clomid 100 mg #10 prescription sent to pharmacy.  Counseling on treatment to overcome anovulation.  Will start with increasing Clomid, but if not successful, may try Femara and if still no success, will refer to Fertility specialist for Hyperstimulation/IVF.  Other orders - clomiPHENE (CLOMID) 50 MG tablet; Take 2 tablets (100 mg total) by mouth daily for 5 days. From day 3 to day 7 of the cycle.  Rhena, un placer verle hoy!

## 2017-07-14 ENCOUNTER — Other Ambulatory Visit: Payer: Self-pay | Admitting: *Deleted

## 2017-07-14 DIAGNOSIS — N970 Female infertility associated with anovulation: Secondary | ICD-10-CM

## 2017-07-20 ENCOUNTER — Ambulatory Visit (INDEPENDENT_AMBULATORY_CARE_PROVIDER_SITE_OTHER): Payer: Self-pay | Admitting: Obstetrics & Gynecology

## 2017-07-20 ENCOUNTER — Encounter: Payer: Self-pay | Admitting: Obstetrics & Gynecology

## 2017-07-20 ENCOUNTER — Ambulatory Visit (INDEPENDENT_AMBULATORY_CARE_PROVIDER_SITE_OTHER): Payer: Self-pay

## 2017-07-20 VITALS — BP 130/84

## 2017-07-20 DIAGNOSIS — N970 Female infertility associated with anovulation: Secondary | ICD-10-CM

## 2017-07-20 MED ORDER — CHORIOGONADOTROPIN ALFA 250 MCG/0.5ML ~~LOC~~ INJ
250.0000 ug | INJECTION | Freq: Once | SUBCUTANEOUS | Status: AC
  Administered 2017-07-20: 0.25 mg via SUBCUTANEOUS

## 2017-07-20 NOTE — Progress Notes (Signed)
    Adriana Hahn 1990-05-13 [id number]        27 y.o.  G0P0000   RP: Day# 11 of a Clomid cycle for Adriana Hahn follicles  HPI: Clomid 100 mg day 3-7th.  No pelvic pain.     OB History  Gravida Para Term Preterm AB Living  0 0 0 0 0 0  SAB TAB Ectopic Multiple Live Births  0 0 0 0 0    Past medical history,surgical history, problem list, medications, allergies, family history and social history were all reviewed and documented in the EPIC chart.   Directed ROS with pertinent positives and negatives documented in the history of present illness/assessment and plan.  Exam:  Vitals:   07/20/17 1257  BP: 130/84   General appearance:  Normal  Pelvic Adriana Hahn today: T/V images.  Retroverted uterus with tri-layered endometrium measuring 11.9 mm.  Right ovary with 19 follicles in the 10 mm and less range.  Left ovary with a dominant 17.9 mm follicle.  Mild fluid adjacent to the left ovary.  No free fluid in the posterior cul-de-sac.   Assessment/Plan:  27 y.o. G0P0000   1. Primary anovulatory infertility Primary anovulatory infertility responding to Clomid 100 mg with a dominant follicle on the left ovary.  Ovidrel injection today.  Offered intrauterine insemination with sperm wash, but couple prefers natural intercourse for this cycle.  Timed intercourse discussed with husband and patient.  Other orders - Choriogonadotropin Alfa 0.25 mg  Counseling on above issues and coordination of care more than 50% for 15 minutes.  Adriana DelMarie-Lyne [name] MD, 1:08 PM 07/20/2017

## 2017-07-21 ENCOUNTER — Encounter: Payer: Self-pay | Admitting: Obstetrics & Gynecology

## 2017-07-21 NOTE — Patient Instructions (Signed)
1. Primary anovulatory infertility Primary anovulatory infertility responding to Clomid 100 mg with a dominant follicle on the left ovary.  Ovidrel injection today.  Offered intrauterine insemination with sperm wash, but couple prefers natural intercourse for this cycle.  Timed intercourse discussed with husband and patient.  Other orders - Choriogonadotropin Alfa 0.25 mg  Steve, good seeing you today!

## 2017-08-08 ENCOUNTER — Other Ambulatory Visit: Payer: Self-pay | Admitting: Obstetrics & Gynecology

## 2017-08-08 ENCOUNTER — Telehealth: Payer: Self-pay | Admitting: *Deleted

## 2017-08-08 DIAGNOSIS — N8300 Follicular cyst of ovary, unspecified side: Secondary | ICD-10-CM

## 2017-08-08 MED ORDER — LETROZOLE 2.5 MG PO TABS: ORAL_TABLET | ORAL | 0 refills | Status: DC

## 2017-08-08 NOTE — Telephone Encounter (Signed)
Patient informed new RX sent to her pharmacy.

## 2017-08-08 NOTE — Telephone Encounter (Signed)
Agree with Femara 2.5 mg BID day 3-7th.

## 2017-08-08 NOTE — Telephone Encounter (Signed)
Rx sent, Adriana Hahn will you let patient know.

## 2017-08-08 NOTE — Telephone Encounter (Signed)
Adriana Hahn spoke with patient, she called stating her cycle started on 08/07/15 and would like Rx for Femara, based on 06/09/17 note if no success on clomid Rx would be sent in for this. Please advise

## 2017-08-15 ENCOUNTER — Ambulatory Visit (INDEPENDENT_AMBULATORY_CARE_PROVIDER_SITE_OTHER): Payer: Self-pay | Admitting: Obstetrics & Gynecology

## 2017-08-15 ENCOUNTER — Ambulatory Visit (INDEPENDENT_AMBULATORY_CARE_PROVIDER_SITE_OTHER): Payer: Self-pay

## 2017-08-15 DIAGNOSIS — N970 Female infertility associated with anovulation: Secondary | ICD-10-CM

## 2017-08-15 DIAGNOSIS — N8300 Follicular cyst of ovary, unspecified side: Secondary | ICD-10-CM

## 2017-08-15 NOTE — Progress Notes (Signed)
    Adriana Hahn 1990/08/01 [id number]        27 y.o.  G0 Married  RP: Day # 11 Femara cycle for Pelvic US follicles  HPI: LMP 08/05/2017.  No pelvic pain.  No BTB.     OB History  Gravida Para Term Preterm AB Living  0 0 0 0 0 0  SAB TAB Ectopic Multiple Live Births  0 0 0 0 0    Past medical history,surgical history, problem list, medications, allergies, family history and social history were all reviewed and documented in the EPIC chart.   Directed ROS with pertinent positives and negatives documented in the history of present illness/assessment and plan.  Exam:  There were no vitals filed for this visit. General appearance:  Normal  Pelvic US today: T/V images.  Retroverted uterus with normal trial layered endometrium measuring 6.6 mm.  Right ovary with many small follicles and a dominant follicle measuring 16.2 mm.  Left ovary with many small follicles.  No free fluid in the posterior cul-de-sac.   Assessment/Plan:  27 y.o. G0P0000   1. Primary anovulatory infertility Probable dominant follicle on the Rt ovary.  Repeat US follicle in 2 days.  If follicle is adequate, will give Ovidrel.  Then, IUI sperm wash a day later. - US Transvaginal Non-OB; Future  Counseling on above issues and coordination of care >50% x 15 minutes  [name], 4:11 PM 08/15/2017

## 2017-08-16 ENCOUNTER — Telehealth: Payer: Self-pay | Admitting: *Deleted

## 2017-08-16 MED ORDER — CHORIOGONADOTROPIN ALFA 250 MCG/0.5ML ~~LOC~~ INJ: 250.0000 ug | INJECTION | Freq: Once | SUBCUTANEOUS | 0 refills | Status: AC

## 2017-08-16 MED ORDER — CHORIOGONADOTROPIN ALFA 250 MCG/0.5ML ~~LOC~~ INJ: 250.0000 ug | INJECTION | Freq: Once | SUBCUTANEOUS | 0 refills | Status: DC

## 2017-08-16 NOTE — Telephone Encounter (Signed)
Rx has been sent  

## 2017-08-16 NOTE — Telephone Encounter (Signed)
Rx called in, since medication can not be sent via epic.

## 2017-08-16 NOTE — Telephone Encounter (Signed)
-----   Message from [name], MD sent at 08/15/2017  4:25 PM EDT ----- Regarding: Ovidrel injection Please send a prescription for Ovidrel injection.  Will need it on 08/17/2017.

## 2017-08-16 NOTE — Telephone Encounter (Signed)
Dr.Lavoie the medication is pending for you to sign and verify the directions and the dose and any refills if needed. I am not familiar with medication. Please advise

## 2017-08-16 NOTE — Telephone Encounter (Signed)
Per Dr Seymour Bars patient to have sperm washing on Thursday morning and insemination her no later than 10:00 am..   I called and left message with Dolphus Jenny at [phone] [facility] to call me to find out the process to schedule the sperm washing.

## 2017-08-17 ENCOUNTER — Ambulatory Visit (INDEPENDENT_AMBULATORY_CARE_PROVIDER_SITE_OTHER): Payer: Self-pay

## 2017-08-17 ENCOUNTER — Ambulatory Visit (INDEPENDENT_AMBULATORY_CARE_PROVIDER_SITE_OTHER): Payer: Self-pay | Admitting: Obstetrics & Gynecology

## 2017-08-17 ENCOUNTER — Encounter: Payer: Self-pay | Admitting: Obstetrics & Gynecology

## 2017-08-17 DIAGNOSIS — N970 Female infertility associated with anovulation: Secondary | ICD-10-CM

## 2017-08-17 NOTE — Patient Instructions (Signed)
1. Primary anovulatory infertility Probable dominant follicle on the Rt ovary.  Repeat US follicle in 2 days.  If follicle is adequate, will give Ovidrel.  Then, IUI sperm wash a day later. - US Transvaginal Non-OB; Future  Aspin, fue un placer verle hoy!

## 2017-08-17 NOTE — Telephone Encounter (Signed)
Spoke Aida Puffer and pt is scheduled on 08/18/17 @ 10:00am at the [place] location [address] the cost for the sperm washing will be $200 and both need photo ID cards. The will give them a sperm kit once arriving at the office.   Dr. Dellis Filbert wanted me to check with  Dr.Fontaine to see if he would do the insemination and he said that would be fine.   Rosemarie Ax will and relay to patient.

## 2017-08-17 NOTE — Progress Notes (Signed)
    Adriana Hahn [birth year] [id number]        27 y.o.  G0P0000 Married  RP: Primary anovulatory infertility for f/u Pelvic US follicle study  HPI: Day #13 of Femara cycle.  No BTB.  No pelvic pain.   OB History  Gravida Para Term Preterm AB Living  0 0 0 0 0 0  SAB TAB Ectopic Multiple Live Births  0 0 0 0 0    Past medical history,surgical history, problem list, medications, allergies, family history and social history were all reviewed and documented in the EPIC chart.   Directed ROS with pertinent positives and negatives documented in the history of present illness/assessment and plan.  Exam:  There were no vitals filed for this visit. General appearance:  Normal  Pelvic ultrasound today: Uterus retroverted with tri-layered endometrium.  Endometrial lining measured at 12.4 mm.  Right ovary with a dominant follicle measured at 20.7 mm.  Many follicles less than 12 mm on the right ovary.  Many follicles less than 8 mm on the left ovary. No free fluid in the posterior cul-de-sac.   Assessment/Plan:  27 y.o. G0P0000   1. Primary anovulatory infertility Dominant follicle on the right ovary measuring 20.7 mm.  Ovidrel injection today.  We will follow-up tomorrow for IUI with sperm wash.  Patient understands and agrees with plan.  Counseling on above issues and coordination of care more than 50% for 15 minutes.  Adriana Del MD, 5:10 PM 08/17/2017

## 2017-08-18 ENCOUNTER — Ambulatory Visit: Payer: Self-pay | Admitting: Obstetrics & Gynecology

## 2017-08-18 ENCOUNTER — Ambulatory Visit (INDEPENDENT_AMBULATORY_CARE_PROVIDER_SITE_OTHER): Payer: Self-pay | Admitting: Gynecology

## 2017-08-18 ENCOUNTER — Encounter: Payer: Self-pay | Admitting: Gynecology

## 2017-08-18 ENCOUNTER — Encounter: Payer: Self-pay | Admitting: Obstetrics & Gynecology

## 2017-08-18 VITALS — BP 116/72

## 2017-08-18 DIAGNOSIS — N970 Female infertility associated with anovulation: Secondary | ICD-10-CM

## 2017-08-18 NOTE — Telephone Encounter (Signed)
Late entry) order was faxed to sperm washing as well to [phone]

## 2017-08-18 NOTE — Patient Instructions (Signed)
1. Primary anovulatory infertility Dominant follicle on the right ovary measuring 20.7 mm.  Ovidrel injection today.  We will follow-up tomorrow for IUI with sperm wash.  Patient understands and agrees with plan.  Sherlynn, un placer verle hoy!

## 2017-08-18 NOTE — Progress Notes (Signed)
    Adriana Hahn 1990/08/16 [id number]        27 y.o.  G0P0000 presents for IUI as arranged by Dr Seymour Bars.  Past medical history,surgical history, problem list, medications, allergies, family history and social history were all reviewed and documented in the EPIC chart.  Directed ROS with pertinent positives and negatives documented in the history of present illness/assessment and plan.  Exam: Kennon Portela assistant Vitals:   08/18/17 1146  BP: 116/72   General appearance:  Normal Abdomen soft nontender without masses guarding rebound Pelvic external BUS vagina normal.  Cervix normal.  Uterus normal size midline mobile nontender.  Adnexa without masses or tenderness  Procedure: IUI was performed using the specimen provided by the husband without difficulty.  Assessment/Plan:  27 y.o. G0P0000 with successful IUI.  Will follow up at the end of this cycle as arranged.    Dara Lords MD, 11:55 AM 08/18/2017

## 2017-08-18 NOTE — Telephone Encounter (Signed)
Adriana Hahn informed patient with all the below.  

## 2017-08-18 NOTE — Patient Instructions (Signed)
Follow-up in 2 weeks

## 2017-09-05 ENCOUNTER — Telehealth: Payer: Self-pay | Admitting: *Deleted

## 2017-09-05 NOTE — Telephone Encounter (Signed)
-----   Message from Jerilynn Mageslaudia Shaffer sent at 09/05/2017  2:05 PM EDT ----- Regarding: nurse call On March 7 note Dr Seymour BarsLavoie [facility] if not successful than refer to infertility specialist. Patient did Femara last month and did not conceive patient started her cycle. Patient wants to know if ML will do another cycle here or refer her out?

## 2017-09-05 NOTE — Telephone Encounter (Signed)
Dr.Lavoie see below note, refer to Dr.Yalcinkya office?

## 2017-09-05 NOTE — Telephone Encounter (Signed)
Yes, refer to Dr Jamse ArnYalcinkya

## 2017-09-06 NOTE — Telephone Encounter (Signed)
Referral faxed they will call to schedule

## 2017-10-04 NOTE — Telephone Encounter (Signed)
Patient had visit on 09/27/17

## 2018-08-16 ENCOUNTER — Encounter: Payer: Self-pay | Admitting: Obstetrics & Gynecology

## 2018-08-16 ENCOUNTER — Other Ambulatory Visit: Payer: Self-pay

## 2018-08-16 ENCOUNTER — Ambulatory Visit (INDEPENDENT_AMBULATORY_CARE_PROVIDER_SITE_OTHER): Payer: Self-pay | Admitting: Obstetrics & Gynecology

## 2018-08-16 VITALS — BP 126/78 | Ht 61.0 in | Wt 153.0 lb

## 2018-08-16 DIAGNOSIS — N914 Secondary oligomenorrhea: Secondary | ICD-10-CM

## 2018-08-16 DIAGNOSIS — N979 Female infertility, unspecified: Secondary | ICD-10-CM

## 2018-08-16 DIAGNOSIS — Z01419 Encounter for gynecological examination (general) (routine) without abnormal findings: Secondary | ICD-10-CM

## 2018-08-16 NOTE — Progress Notes (Signed)
Adriana Hahn 18-Jul-1990 [id number]   History:    28 y.o. G0 Married  RP:  Established patient presenting for annual gyn exam   HPI: Primary infertility, considering IVF, seen by fertility specialist already.  Skipped her menses in 07/2018.  LMP very light 08/06/2018.  No pelvic pain.  No pain with intercourse.  Normal vaginal secretions.  Urine and bowel movements normal.  Breast normal.  Body mass index 28.91.  Not exercising regularly.  Healthy nutrition.  Past medical history,surgical history, family history and social history were all reviewed and documented in the EPIC chart.  Gynecologic History Patient's last menstrual period was 08/06/2018. Contraception: none Last Pap: 07/2016. Results were: Negative Last mammogram: Never Bone Density: Never Colonoscopy: Never  Obstetric History OB History  Gravida Para Term Preterm AB Living  0 0 0 0 0 0  SAB TAB Ectopic Multiple Live Births  0 0 0 0 0     ROS: A ROS was performed and pertinent positives and negatives are included in the history.  GENERAL: No fevers or chills. HEENT: No change in vision, no earache, sore throat or sinus congestion. NECK: No pain or stiffness. CARDIOVASCULAR: No chest pain or pressure. No palpitations. PULMONARY: No shortness of breath, cough or wheeze. GASTROINTESTINAL: No abdominal pain, nausea, vomiting or diarrhea, melena or bright red blood per rectum. GENITOURINARY: No urinary frequency, urgency, hesitancy or dysuria. MUSCULOSKELETAL: No joint or muscle pain, no back pain, no recent trauma. DERMATOLOGIC: No rash, no itching, no lesions. ENDOCRINE: No polyuria, polydipsia, no heat or cold intolerance. No recent change in weight. HEMATOLOGICAL: No anemia or easy bruising or bleeding. NEUROLOGIC: No headache, seizures, numbness, tingling or weakness. PSYCHIATRIC: No depression, no loss of interest in normal activity or change in sleep pattern.     Exam:   BP 126/78   Ht 5\' 1"  (1.549 m)   Wt 153 lb  (69.4 kg)   LMP 08/06/2018   BMI 28.91 kg/m   Body mass index is 28.91 kg/m.  General appearance : Well developed well nourished female. No acute distress HEENT: Eyes: no retinal hemorrhage or exudates,  Neck supple, trachea midline, no carotid bruits, no thyroidmegaly Lungs: Clear to auscultation, no rhonchi or wheezes, or rib retractions  Heart: Regular rate and rhythm, no murmurs or gallops Breast:Examined in sitting and supine position were symmetrical in appearance, no palpable masses or tenderness,  no skin retraction, no nipple inversion, no nipple discharge, no skin discoloration, no axillary or supraclavicular lymphadenopathy Abdomen: no palpable masses or tenderness, no rebound or guarding Extremities: no edema or skin discoloration or tenderness  Pelvic: Vulva: Normal             Vagina: No gross lesions or discharge  Cervix: No gross lesions or discharge.  Pap reflex done.  Uterus  AV, normal size, shape and consistency, non-tender and mobile  Adnexa  Without masses or tenderness  Anus: Normal  UPT Negative   Assessment/Plan:  28 y.o. female for annual exam   1. Encounter for routine gynecological examination with Papanicolaou smear of cervix Normal gynecologic exam.  Pap reflex done.  Breast exam normal.  Body mass index 28.91.  Recommend a lower calorie/carb diet such as [employer] and increased aerobic activities to 5 times a week with weightlifting every 2 days.  2. Primary female infertility Consulted with a fertility clinic.  Considering IVF. - Pregnancy, urine  3. Secondary oligomenorrhea Probable micro ovulation in the context of infertility.  Rule out pregnancy.  UPT negative. - Pregnancy, urine  Adriana [name] MD, 3:24 PM 08/16/2018

## 2018-08-17 ENCOUNTER — Encounter: Payer: Self-pay | Admitting: Obstetrics & Gynecology

## 2018-08-17 LAB — PREGNANCY, URINE: Preg Test, Ur: NEGATIVE

## 2018-08-17 NOTE — Patient Instructions (Signed)
1. Encounter for routine gynecological examination with Papanicolaou smear of cervix Normal gynecologic exam.  Pap reflex done.  Breast exam normal.  Body mass index 28.91.  Recommend a lower calorie/carb diet such as [employer] and increased aerobic activities to 5 times a week with weightlifting every 2 days.  2. Primary female infertility Consulted with a fertility clinic.  Considering IVF. - Pregnancy, urine  3. Secondary oligomenorrhea Probable micro ovulation in the context of infertility.  Rule out pregnancy.  UPT negative. - Pregnancy, urine  Monseratt, fue un placer verle hoy!  Voy a informarle de sus [employer].

## 2018-08-22 LAB — PAP IG W/ RFLX HPV ASCU

## 2018-08-22 LAB — HUMAN PAPILLOMAVIRUS, HIGH RISK: HPV DNA High Risk: NOT DETECTED

## 2018-11-03 ENCOUNTER — Encounter: Payer: Self-pay | Admitting: *Deleted

## 2018-11-23 ENCOUNTER — Telehealth: Payer: Self-pay | Admitting: *Deleted

## 2018-11-23 NOTE — Telephone Encounter (Signed)
Received a call from [facility]. She states she received a call about her upcoming appointments and wanted to clarify when her appointments are. Call interpreted by Laural Golden, Interpreter and notified patient of her upcoming virtual appointment on 11/27/18 and in person appointment on 12/08/18. She voices understanding.  Linda,RN

## 2018-11-24 ENCOUNTER — Telehealth: Payer: Self-pay | Admitting: Family Medicine

## 2018-11-24 NOTE — Telephone Encounter (Signed)
Spoke w/ patient w/ Spanish interpreter Raquel about appointment on 8/24 @ 9:30. Patient instructed that the visit  Will be a telephone visit. Patient verbalized understanding

## 2018-11-27 ENCOUNTER — Ambulatory Visit (INDEPENDENT_AMBULATORY_CARE_PROVIDER_SITE_OTHER): Payer: Self-pay

## 2018-11-27 ENCOUNTER — Other Ambulatory Visit: Payer: Self-pay

## 2018-11-27 DIAGNOSIS — O0993 Supervision of high risk pregnancy, unspecified, third trimester: Secondary | ICD-10-CM | POA: Insufficient documentation

## 2018-11-27 DIAGNOSIS — O0992 Supervision of high risk pregnancy, unspecified, second trimester: Secondary | ICD-10-CM | POA: Insufficient documentation

## 2018-11-27 DIAGNOSIS — Z3490 Encounter for supervision of normal pregnancy, unspecified, unspecified trimester: Secondary | ICD-10-CM

## 2018-11-27 NOTE — Progress Notes (Signed)
I connected with  Kizzie Furnish on 11/27/18 at  9:30 AM EDT by telephone and verified that I am speaking with the correct person using two identifiers.   I discussed the limitations, risks, security and privacy concerns of performing an evaluation and management service by telephone and the availability of in person appointments. I also discussed with the patient that there may be a patient responsible charge related to this service. The patient expressed understanding and agreed to proceed.  With [facility] # [phone], pt reports that she is delay in care because she had artifical insemination from [facility] and pt unable to explain to me the EDD that CFI gave her.  Pt reports that she started spotting on 09/02/18 and then did not have any other periods and that .  Pt hx obtained.  Pt explained that we will give her a BP cuff from the office and that we will set her up at her NEW OB appt on 12/08/18.  Pt reports having a pap smear on 08/17/18 which is in chart from [place].  I explained to the pt that we will offer her genetic screening, do gc/ch, OB panel, OB urine cx, and HgBA1C due to mother being a diabetic.   Pt has been added to Bab[employer]s however pt was having trouble in creating a password.  Pt explained that we will have to get her enrolled in Babyscripts at her appt scheduled on 12/08/18.  Pt verbalized understanding.   Verdell Carmine, RN 11/27/2018  9:40 AM

## 2018-12-07 ENCOUNTER — Telehealth: Payer: Self-pay

## 2018-12-07 NOTE — Telephone Encounter (Signed)
Pt called into the office to verify her appointment spoke with raquel and I advised raquel to tell her to arrive 33mins prior to appointment. And that her husband cannot attend and to wear a mask

## 2018-12-08 ENCOUNTER — Encounter: Payer: Self-pay | Admitting: Family Medicine

## 2018-12-08 ENCOUNTER — Other Ambulatory Visit: Payer: Self-pay

## 2018-12-08 ENCOUNTER — Ambulatory Visit (INDEPENDENT_AMBULATORY_CARE_PROVIDER_SITE_OTHER): Payer: Self-pay | Admitting: Family Medicine

## 2018-12-08 VITALS — BP 117/80 | HR 78 | Wt 143.5 lb

## 2018-12-08 DIAGNOSIS — R87610 Atypical squamous cells of undetermined significance on cytologic smear of cervix (ASC-US): Secondary | ICD-10-CM | POA: Insufficient documentation

## 2018-12-08 DIAGNOSIS — Z3A13 13 weeks gestation of pregnancy: Secondary | ICD-10-CM

## 2018-12-08 DIAGNOSIS — O09819 Supervision of pregnancy resulting from assisted reproductive technology, unspecified trimester: Secondary | ICD-10-CM

## 2018-12-08 DIAGNOSIS — O09811 Supervision of pregnancy resulting from assisted reproductive technology, first trimester: Secondary | ICD-10-CM

## 2018-12-08 DIAGNOSIS — E282 Polycystic ovarian syndrome: Secondary | ICD-10-CM

## 2018-12-08 DIAGNOSIS — Z113 Encounter for screening for infections with a predominantly sexual mode of transmission: Secondary | ICD-10-CM

## 2018-12-08 DIAGNOSIS — Z3492 Encounter for supervision of normal pregnancy, unspecified, second trimester: Secondary | ICD-10-CM

## 2018-12-08 NOTE — Progress Notes (Addendum)
Interpreter [employer] present for encounter. Pt wants to know if she should continue taking Metformin. She was placed on the medicine due to PCOS. BP cuff given and instructions for use provided.

## 2018-12-08 NOTE — Progress Notes (Signed)
Subjective:  Adriana Hahn is a G1P0000 [redacted]w[redacted]d being seen today for her first obstetrical visit.  Her obstetrical history is significant for history of infertility related to PCOS. She had IUI which resulted in this pregnancy. During her workup, she was placed on metformin. She is unaware of any diagnosis or testin regarding diabetes or insulin resistance.. Patient does intend to breast feed. Pregnancy history fully reviewed.  Patient reports no complaints.  BP 117/80   Pulse 78   Wt 143 lb 8 oz (65.1 kg)   LMP 09/02/2018 (Exact Date)   BMI 27.11 kg/m   HISTORY: OB History  Gravida Para Term Preterm AB Living  1 0 0 0 0 0  SAB TAB Ectopic Multiple Live Births  0 0 0 0 0    # Outcome Date GA Lbr Len/2nd Weight Sex Delivery Anes PTL Lv  1 Current             Past Medical History:  Diagnosis Date  . Migraines   . PCOS (polycystic ovarian syndrome)     No past surgical history on file.  Family History  Problem Relation Age of Onset  . Diabetes Mother      Exam  BP 117/80   Pulse 78   Wt 143 lb 8 oz (65.1 kg)   LMP 09/02/2018 (Exact Date)   BMI 27.11 kg/m   CONSTITUTIONAL: Well-developed, well-nourished female in no acute distress.  HENT:  Normocephalic, atraumatic, External right and left ear normal. Oropharynx is clear and moist EYES: Conjunctivae and EOM are normal. Pupils are equal, round, and reactive to light. No scleral icterus.  NECK: Normal range of motion, supple, no masses.  Normal thyroid.  CARDIOVASCULAR: Normal heart rate noted, regular rhythm RESPIRATORY: Clear to auscultation bilaterally. Effort and breath sounds normal, no problems with respiration noted. BREASTS: Symmetric in size. No masses, skin changes, nipple drainage, or lymphadenopathy. ABDOMEN: Soft, normal bowel sounds, no distention noted.  No tenderness, rebound or guarding.  PELVIC: Normal appearing external genitalia; normal appearing vaginal mucosa and cervix. No abnormal discharge noted.  Normal uterine size, no other palpable masses, no uterine or adnexal tenderness. MUSCULOSKELETAL: Normal range of motion. No tenderness.  No cyanosis, clubbing, or edema.  2+ distal pulses. SKIN: Skin is warm and dry. No rash noted. Not diaphoretic. No erythema. No pallor. NEUROLOGIC: Alert and oriented to person, place, and time. Normal reflexes, muscle tone coordination. No cranial nerve deficit noted. PSYCHIATRIC: Normal mood and affect. Normal behavior. Normal judgment and thought content.    Assessment:    Pregnancy: G1P0000 Patient Active Problem List   Diagnosis Date Noted  . PCOS (polycystic ovarian syndrome) 12/08/2018  . ASCUS of cervix with negative high risk HPV 12/08/2018  . Supervision of low-risk pregnancy 11/27/2018  . Infertility, female 07/12/2016      Plan:   1. Encounter for supervision of low-risk pregnancy in second trimester Dating based on IUI conception date. Panorama and Carrier screen offered - patient is undecided Will schedule routine [place] around 20 weeks - GC/Chlamydia probe amp ()not at [facility] - Culture, OB Urine - Obstetric Panel, Including HIV - Hemoglobin A1c  2. PCOS (polycystic ovarian syndrome) Patient on metformin - unaware of insulin resistant state. Patient having symptomatic, low blood sugars. Will stop metformin, check HgA1c, and schedule for 2hr GTT next week.  3. ASCUS of cervix with negative high risk HPV Recheck PAP in 1 year (after pregnancy)  4. Pregnancy resulting from assisted reproductive technology, antepartum  Problem list reviewed and updated. 75% of 30 min visit spent on counseling and coordination of care.     Levie HeritageJacob J Kharter Sestak 12/08/2018

## 2018-12-09 LAB — OBSTETRIC PANEL, INCLUDING HIV
Antibody Screen: NEGATIVE
Basophils Absolute: 0 10*3/uL (ref 0.0–0.2)
Basos: 0 %
EOS (ABSOLUTE): 0.1 10*3/uL (ref 0.0–0.4)
Eos: 1 %
HIV Screen 4th Generation wRfx: NONREACTIVE
Hematocrit: 35.2 % (ref 34.0–46.6)
Hemoglobin: 11.6 g/dL (ref 11.1–15.9)
Hepatitis B Surface Ag: NEGATIVE
Immature Grans (Abs): 0 10*3/uL (ref 0.0–0.1)
Immature Granulocytes: 1 %
Lymphocytes Absolute: 2.1 10*3/uL (ref 0.7–3.1)
Lymphs: 25 %
MCH: 28.9 pg (ref 26.6–33.0)
MCHC: 33 g/dL (ref 31.5–35.7)
MCV: 88 fL (ref 79–97)
Monocytes Absolute: 0.3 10*3/uL (ref 0.1–0.9)
Monocytes: 4 %
Neutrophils Absolute: 6 10*3/uL (ref 1.4–7.0)
Neutrophils: 69 %
Platelets: 255 10*3/uL (ref 150–450)
RBC: 4.02 x10E6/uL (ref 3.77–5.28)
RDW: 14.2 % (ref 11.7–15.4)
RPR Ser Ql: NONREACTIVE
Rh Factor: POSITIVE
Rubella Antibodies, IGG: 1.68 index (ref >0.99)
WBC: 8.6 10*3/uL (ref 3.4–10.8)

## 2018-12-09 LAB — GC/CHLAMYDIA PROBE AMP (~~LOC~~) NOT AT ARMC
Chlamydia: NEGATIVE
Neisseria Gonorrhea: NEGATIVE

## 2018-12-09 LAB — HEMOGLOBIN A1C
Est. average glucose Bld gHb Est-mCnc: 103 mg/dL
Hgb A1c MFr Bld: 5.2 % (ref 4.8–5.6)

## 2018-12-11 LAB — URINE CULTURE, OB REFLEX

## 2018-12-11 LAB — CULTURE, OB URINE

## 2018-12-12 ENCOUNTER — Other Ambulatory Visit: Payer: Self-pay

## 2018-12-12 DIAGNOSIS — Z3492 Encounter for supervision of normal pregnancy, unspecified, second trimester: Secondary | ICD-10-CM

## 2018-12-12 MED ORDER — SULFAMETHOXAZOLE-TRIMETHOPRIM 800-160 MG PO TABS: 1.0000 | ORAL_TABLET | Freq: Two times a day (BID) | ORAL | 1 refills | Status: DC

## 2018-12-12 NOTE — Addendum Note (Signed)
Addended by: Truett Mainland on: 12/12/2018 09:45 AM   Modules accepted: Orders

## 2018-12-12 NOTE — Progress Notes (Signed)
1200  Pt informed with assistance from interpreter Eda Royal that she has UTI and medication has been prescribed. She may pick up Rx today and take 1 tablet twice daily. Pt voiced understanding.

## 2018-12-13 LAB — GLUCOSE TOLERANCE, 2 HOURS W/ 1HR
Glucose, 1 hour: 163 mg/dL (ref 65–179)
Glucose, 2 hour: 156 mg/dL — ABNORMAL HIGH (ref 65–152)
Glucose, Fasting: 84 mg/dL (ref 65–91)

## 2018-12-14 ENCOUNTER — Encounter: Payer: Self-pay | Admitting: Obstetrics and Gynecology

## 2018-12-14 DIAGNOSIS — E119 Type 2 diabetes mellitus without complications: Secondary | ICD-10-CM | POA: Insufficient documentation

## 2018-12-15 ENCOUNTER — Telehealth: Payer: Self-pay

## 2018-12-15 NOTE — Telephone Encounter (Signed)
Pt returned call back and with Spanish Interpreter Raquel M., informed pt about dx of DM and her appt with Bev on 12/20/18 @ 1600.  Pt verbalized understanding.

## 2018-12-15 NOTE — Telephone Encounter (Addendum)
-----   Message from Sloan Leiter, MD sent at 12/14/2018  2:12 PM EDT ----- Elevated early 2 hr GTT, patient with DM. Please let her know, she is spanish speaking, get her set up with Diabetes and nutrition. Thanks.  LM with Spanish Interpreter Raquel M. That I am calling with results and that we have scheduled her an appt for September 16th @ 1600 for Diabetes Education.

## 2018-12-20 ENCOUNTER — Ambulatory Visit: Payer: Self-pay | Admitting: *Deleted

## 2018-12-20 ENCOUNTER — Other Ambulatory Visit: Payer: Self-pay

## 2018-12-20 ENCOUNTER — Encounter: Payer: Self-pay | Attending: Obstetrics & Gynecology | Admitting: *Deleted

## 2018-12-20 DIAGNOSIS — Z713 Dietary counseling and surveillance: Secondary | ICD-10-CM | POA: Insufficient documentation

## 2018-12-20 DIAGNOSIS — O24419 Gestational diabetes mellitus in pregnancy, unspecified control: Secondary | ICD-10-CM | POA: Insufficient documentation

## 2018-12-20 DIAGNOSIS — R7302 Impaired glucose tolerance (oral): Secondary | ICD-10-CM

## 2018-12-20 NOTE — Progress Notes (Signed)
Patient was seen on 12/20/2018 for Impaired Glucose Tolerance at 15 weeks self-management. Patient speaks Spanish, we used [employer] Interpretor for this visit. EDD 06/09/2019. Patient states history of PCOS and taking Metformin for the past year. She states she has checked her blood sugars in [place] but does not have a meter now. Diet history obtained. Patient eats good variety of all food groups and beverages include decaf coffee with 1/2 tsp sugar, water and fruit water.  She states no training on Carbohydrate Counting. Patient is currently on no diabetes medications, the Metformin has been stopped with the pregnancy..  The following learning objectives were met by the patient :   States the definition of Impaired Glucose Tolerance at 15 weeks   States why dietary management is important in controlling blood glucose  Eats a good variety of all food groups with carb containing foods in moderation  Demonstrates ability to create a balanced meal plan  States when to check blood glucose levels  Demonstrates proper blood glucose monitoring techniques  States the effect of stress and exercise on blood glucose levels  States the importance of limiting caffeine and abstaining from alcohol and smoking  Plan:   Continue with your current eating habits, avoiding concentrated sweets  Continue with your activity level by walking or other activity daily as tolerated  Begin checking BG before breakfast and 2 hours after first bite of breakfast, lunch and dinner as directed by MD   Bring Log Book/Sheet and meter to every medical appointment   Patient not appropriate for Baby Scripts due to language barrier   Take medication as directed by MD  Blood glucose monitor given: [employer] Code Lot # [id number]  Blood glucose reading: 69 mg/dl  Patient instructed to test pre breakfast and 2 hours each meal as directed by MD  Patient instructed to monitor glucose levels: FBS: 60 - 95 mg/dl 2 hour:  <120 mg/dl  Patient received the following handouts: In Spanish  Nutrition Diabetes and Pregnancy  Carbohydrate Counting List  Patient will be seen for follow-up in 1 month to evaluate blood sugars and food choices. Review carbohydrate counting at that time.

## 2018-12-25 ENCOUNTER — Encounter: Payer: Self-pay | Admitting: Gynecology

## 2019-01-08 ENCOUNTER — Telehealth: Payer: Self-pay | Admitting: Family Medicine

## 2019-01-08 NOTE — Telephone Encounter (Signed)
Called the patient with the in office interpreter Mariol to confirm the upcoming appointment for mychart. The patient verbalized understanding.

## 2019-01-09 ENCOUNTER — Telehealth (INDEPENDENT_AMBULATORY_CARE_PROVIDER_SITE_OTHER): Payer: Self-pay | Admitting: Student

## 2019-01-09 ENCOUNTER — Other Ambulatory Visit: Payer: Self-pay

## 2019-01-09 VITALS — BP 115/80 | HR 85

## 2019-01-09 DIAGNOSIS — Z3A18 18 weeks gestation of pregnancy: Secondary | ICD-10-CM

## 2019-01-09 DIAGNOSIS — O24112 Pre-existing diabetes mellitus, type 2, in pregnancy, second trimester: Secondary | ICD-10-CM

## 2019-01-09 DIAGNOSIS — O0992 Supervision of high risk pregnancy, unspecified, second trimester: Secondary | ICD-10-CM

## 2019-01-09 DIAGNOSIS — E119 Type 2 diabetes mellitus without complications: Secondary | ICD-10-CM

## 2019-01-09 MED ORDER — ASPIRIN EC 81 MG PO TBEC: 81.0000 mg | DELAYED_RELEASE_TABLET | Freq: Every day | ORAL | 5 refills | Status: DC

## 2019-01-09 NOTE — Telephone Encounter (Signed)
Opened in error

## 2019-01-09 NOTE — Progress Notes (Signed)
I connected with@ on 01/09/19 at  9:15 AM EDT by: Webex and verified that I am speaking with the correct person using two identifiers.  Patient is located at house and provider is located at [employer].    The purpose of this virtual visit is to provide medical care while limiting exposure to the novel coronavirus. I discussed the limitations, risks, security and privacy concerns of performing an evaluation and management service by WebEx and the availability of in person appointments. I also discussed with the patient that there may be a patient responsible charge related to this service. By engaging in this virtual visit, you consent to the provision of healthcare.  Additionally, you authorize for your insurance to be billed for the services provided during this visit.  The patient expressed understanding and agreed to proceed.  The following staff members participated in the virtual visit:  Carver Fila    PRENATAL VISIT NOTE  Subjective:  Adriana Hahn is a 28 y.o. G1P0000 at [redacted]w[redacted]d  for phone visit for ongoing prenatal care.  She is currently monitored for the following issues for this high-risk pregnancy and has Infertility, female; Encounter for supervision of high risk pregnancy in second trimester, antepartum; PCOS (polycystic ovarian syndrome); ASCUS of cervix with negative high risk HPV; and Diabetes mellitus ([place]) on their problem list.  Patient reports no complaints.  Contractions: Not present. Vag. Bleeding: None.  Movement: Absent. Denies leaking of fluid.   The following portions of the patient's history were reviewed and updated as appropriate: allergies, current medications, past family history, past medical history, past social history, past surgical history and problem list.   Objective:   Vitals:   01/09/19 0919  BP: 115/80  Pulse: 85   Self-Obtained  Fetal Status:     Movement: Absent     Assessment and Plan:  Pregnancy: G1P0000 at [redacted]w[redacted]d 1. Type 2 diabetes mellitus without  complication, unspecified whether long term insulin use (HCC) -highest post prandial is 118 -She reports no levels higher than 120 -72-84 fasting She is doing well otherwise, is able to check her sugars without problems and understands how to use her meter and her BP cuff -will order the suggested ancillary testing for Type 2 DM patient -start on baby ASA  - US Fetal Echocardiography; Future - Ambulatory referral to Ophthalmology - [place] MFM OB DETAIL +14 WK; Future  2. Encounter for supervision of high-risk pregnancy in second trimester   Preterm labor symptoms and general obstetric precautions including but not limited to vaginal bleeding, contractions, leaking of fluid and fetal movement were reviewed in detail with the patient.  Return in about 8 weeks (around 03/06/2019), or see note below, next oB visit is in 8 weeks. Doesn't need GTT but needs other labs.  Future Appointments  Date Time Provider [place]  01/16/2019 11:15 AM [place]  01/22/2019  9:00 AM [place] [facility]  01/22/2019  9:00 AM [facility]-MFC [place] 3 WH-MFCUS M[facility]-US     Time spent on virtual visit: 15 minutes  Starr Lake, CNM

## 2019-01-09 NOTE — Progress Notes (Signed)
8:56a-Called Pt for Virtual visit , no answer, left VM that will call back.    9:15a- I connected with  Adriana Hahn on 01/09/19 at  9:15 AM EDT by telephone and verified that I am speaking with the correct person using two identifiers.   I discussed the limitations, risks, security and privacy concerns of performing an evaluation and management service by telephone and the availability of in person appointments. I also discussed with the patient that there may be a patient responsible charge related to this service. The patient expressed understanding and agreed to proceed.  [name], [place] 01/09/2019  9:16 AM

## 2019-01-16 ENCOUNTER — Other Ambulatory Visit: Payer: Self-pay

## 2019-01-16 ENCOUNTER — Encounter: Payer: Self-pay | Attending: Obstetrics & Gynecology | Admitting: *Deleted

## 2019-01-16 ENCOUNTER — Ambulatory Visit: Payer: Self-pay | Admitting: *Deleted

## 2019-01-16 DIAGNOSIS — O24419 Gestational diabetes mellitus in pregnancy, unspecified control: Secondary | ICD-10-CM | POA: Insufficient documentation

## 2019-01-16 DIAGNOSIS — Z713 Dietary counseling and surveillance: Secondary | ICD-10-CM | POA: Insufficient documentation

## 2019-01-17 NOTE — Progress Notes (Signed)
Patient was seen on 01/16/2019 for Impaired Glucose Tolerance at 18 weeks self-management follow up visit. Patient speaks Spanish, we used Research scientist (medical) for this visit. EDD 06/09/2019. Patient states history of PCOS and taking Metformin for the past year. Patient is currently on no diabetes medications, the Metformin has been stopped with the pregnancy. She provided diet history for current eating habits and they appear appropriate without carb counting. She states she ran out of strips (some of them got wet) for the Prodigy meter we provided her last month, and went to pharmacy to buy more. She brought her Log Sheet to this appointment which indicates all FBG and post meal numbers are within target ranges. She states she occasionally has tested in less than 2 hours and the numbers were between 140-180 mg/dl but when she tested again at the 2 hour time, they were back down to the recommended levels.   Plan:   Continue with your current eating habits, avoiding concentrated sweets  Continue with your activity level by walking or other activity daily as tolerated  Continue checking BG before breakfast and 2 hours after first bite of breakfast, lunch and dinner as directed by MD   Bring Log Book/Sheet and meter to every medical appointment   Patient not appropriate for Baby Scripts due to language barrier   Take medication as directed by MD  We will provide you with your meter supplies until your baby is born.  Blood glucose monitor supplies given for  Prodigy Auto Code  Patient instructed to monitor glucose levels: FBS: 60 - 95 mg/dl 2 hour: <120 mg/dl  Patient received the following handouts: In Spanish  100 Strips and lancets for Prodigy meter  BG Log Sheet for another month  Patient will be seen for follow-up PRN

## 2019-01-22 ENCOUNTER — Other Ambulatory Visit (HOSPITAL_COMMUNITY): Payer: Self-pay | Admitting: *Deleted

## 2019-01-22 ENCOUNTER — Other Ambulatory Visit: Payer: Self-pay

## 2019-01-22 ENCOUNTER — Ambulatory Visit (HOSPITAL_COMMUNITY): Payer: Self-pay | Admitting: *Deleted

## 2019-01-22 ENCOUNTER — Encounter (HOSPITAL_COMMUNITY): Payer: Self-pay

## 2019-01-22 ENCOUNTER — Ambulatory Visit (HOSPITAL_COMMUNITY)
Admission: RE | Admit: 2019-01-22 | Discharge: 2019-01-22 | Disposition: A | Discharge status: Home / Self Care | Payer: Self-pay | Source: Ambulatory Visit | Attending: Obstetrics and Gynecology | Admitting: Obstetrics and Gynecology

## 2019-01-22 DIAGNOSIS — O09812 Supervision of pregnancy resulting from assisted reproductive technology, second trimester: Secondary | ICD-10-CM

## 2019-01-22 DIAGNOSIS — Z3A20 20 weeks gestation of pregnancy: Secondary | ICD-10-CM

## 2019-01-22 DIAGNOSIS — O0992 Supervision of high risk pregnancy, unspecified, second trimester: Secondary | ICD-10-CM

## 2019-01-22 DIAGNOSIS — O24415 Gestational diabetes mellitus in pregnancy, controlled by oral hypoglycemic drugs: Secondary | ICD-10-CM

## 2019-01-22 DIAGNOSIS — E119 Type 2 diabetes mellitus without complications: Secondary | ICD-10-CM | POA: Insufficient documentation

## 2019-01-26 ENCOUNTER — Other Ambulatory Visit: Payer: Self-pay | Admitting: *Deleted

## 2019-01-26 DIAGNOSIS — O24419 Gestational diabetes mellitus in pregnancy, unspecified control: Secondary | ICD-10-CM

## 2019-02-02 ENCOUNTER — Ambulatory Visit (INDEPENDENT_AMBULATORY_CARE_PROVIDER_SITE_OTHER): Payer: Self-pay | Admitting: Obstetrics and Gynecology

## 2019-02-02 ENCOUNTER — Other Ambulatory Visit: Payer: Self-pay

## 2019-02-02 ENCOUNTER — Encounter: Payer: Self-pay | Admitting: Obstetrics and Gynecology

## 2019-02-02 VITALS — BP 128/70 | HR 85 | Temp 98.0°F | Wt 146.0 lb

## 2019-02-02 DIAGNOSIS — O0992 Supervision of high risk pregnancy, unspecified, second trimester: Secondary | ICD-10-CM

## 2019-02-02 DIAGNOSIS — O24112 Pre-existing diabetes mellitus, type 2, in pregnancy, second trimester: Secondary | ICD-10-CM

## 2019-02-02 DIAGNOSIS — E119 Type 2 diabetes mellitus without complications: Secondary | ICD-10-CM

## 2019-02-02 DIAGNOSIS — Z3A21 21 weeks gestation of pregnancy: Secondary | ICD-10-CM

## 2019-02-02 NOTE — Patient Instructions (Signed)

## 2019-02-02 NOTE — Progress Notes (Signed)
Subjective:  Adriana Hahn is a 28 y.o. G1P0000 at [redacted]w[redacted]d being seen today for ongoing prenatal care.  She is currently monitored for the following issues for this high-risk pregnancy and has Infertility, female; Encounter for supervision of high risk pregnancy in second trimester, antepartum; PCOS (polycystic ovarian syndrome); ASCUS of cervix with negative high risk HPV; and Diabetes mellitus ([place]) on their problem list.  Patient reports no complaints.  Contractions: Not present. Vag. Bleeding: None.  Movement: Present. Denies leaking of fluid.   The following portions of the patient's history were reviewed and updated as appropriate: allergies, current medications, past family history, past medical history, past social history, past surgical history and problem list. Problem list updated.  Objective:   Vitals:   02/02/19 1023  BP: 128/70  Pulse: 85  Temp: 98 F (36.7 C)  Weight: 146 lb (66.2 kg)    Fetal Status: Fetal Heart Rate (bpm): 154   Movement: Present     General:  Alert, oriented and cooperative. Patient is in no acute distress.  Skin: Skin is warm and dry. No rash noted.   Cardiovascular: Normal heart rate noted  Respiratory: Normal respiratory effort, no problems with respiration noted  Abdomen: Soft, gravid, appropriate for gestational age. Pain/Pressure: Present     Pelvic:  Cervical exam deferred        Extremities: Normal range of motion.  Edema: None  Mental Status: Normal mood and affect. Normal behavior. Normal judgment and thought content.   Urinalysis:      Assessment and Plan:  Pregnancy: G1P0000 at [redacted]w[redacted]d  1. Encounter for supervision of high risk pregnancy in second trimester, antepartum Stable Follow up growth scan scheduled  2. Type 2 diabetes mellitus without complication, unspecified whether long term insulin use (HCC) CBG's in goal range Normal growth, follow up scheduled Normal fetal ECHO and eye exam Continue with BASA and diet Pt instructed on  entering CBG's in BS  Preterm labor symptoms and general obstetric precautions including but not limited to vaginal bleeding, contractions, leaking of fluid and fetal movement were reviewed in detail with the patient. Please refer to After Visit Summary for other counseling recommendations.  Return in about 3 weeks (around 02/23/2019) for OB visit, face to face.   Chancy Milroy, MD

## 2019-02-02 NOTE — Progress Notes (Signed)
Pt uses Paper Logs for Glucose Readings but does not have today.

## 2019-02-19 ENCOUNTER — Ambulatory Visit (HOSPITAL_COMMUNITY)
Admission: RE | Admit: 2019-02-19 | Discharge: 2019-02-19 | Disposition: A | Discharge status: Home / Self Care | Payer: Self-pay | Source: Ambulatory Visit | Attending: Obstetrics and Gynecology | Admitting: Obstetrics and Gynecology

## 2019-02-19 ENCOUNTER — Other Ambulatory Visit: Payer: Self-pay

## 2019-02-19 ENCOUNTER — Other Ambulatory Visit (HOSPITAL_COMMUNITY): Payer: Self-pay | Admitting: *Deleted

## 2019-02-19 ENCOUNTER — Ambulatory Visit (HOSPITAL_COMMUNITY): Payer: Self-pay | Admitting: *Deleted

## 2019-02-19 ENCOUNTER — Encounter (HOSPITAL_COMMUNITY): Payer: Self-pay

## 2019-02-19 DIAGNOSIS — O09812 Supervision of pregnancy resulting from assisted reproductive technology, second trimester: Secondary | ICD-10-CM

## 2019-02-19 DIAGNOSIS — Z362 Encounter for other antenatal screening follow-up: Secondary | ICD-10-CM

## 2019-02-19 DIAGNOSIS — O0992 Supervision of high risk pregnancy, unspecified, second trimester: Secondary | ICD-10-CM | POA: Insufficient documentation

## 2019-02-19 DIAGNOSIS — Z3A24 24 weeks gestation of pregnancy: Secondary | ICD-10-CM

## 2019-02-19 DIAGNOSIS — O24419 Gestational diabetes mellitus in pregnancy, unspecified control: Secondary | ICD-10-CM

## 2019-02-19 DIAGNOSIS — O24415 Gestational diabetes mellitus in pregnancy, controlled by oral hypoglycemic drugs: Secondary | ICD-10-CM | POA: Insufficient documentation

## 2019-02-26 ENCOUNTER — Ambulatory Visit (INDEPENDENT_AMBULATORY_CARE_PROVIDER_SITE_OTHER): Payer: Self-pay | Admitting: Family Medicine

## 2019-02-26 ENCOUNTER — Other Ambulatory Visit: Payer: Self-pay

## 2019-02-26 VITALS — BP 116/77 | HR 89 | Wt 150.0 lb

## 2019-02-26 DIAGNOSIS — O24112 Pre-existing diabetes mellitus, type 2, in pregnancy, second trimester: Secondary | ICD-10-CM

## 2019-02-26 DIAGNOSIS — R87610 Atypical squamous cells of undetermined significance on cytologic smear of cervix (ASC-US): Secondary | ICD-10-CM

## 2019-02-26 DIAGNOSIS — Z3A25 25 weeks gestation of pregnancy: Secondary | ICD-10-CM

## 2019-02-26 DIAGNOSIS — O0992 Supervision of high risk pregnancy, unspecified, second trimester: Secondary | ICD-10-CM

## 2019-02-26 DIAGNOSIS — Z23 Encounter for immunization: Secondary | ICD-10-CM

## 2019-02-26 DIAGNOSIS — E282 Polycystic ovarian syndrome: Secondary | ICD-10-CM

## 2019-02-26 DIAGNOSIS — E119 Type 2 diabetes mellitus without complications: Secondary | ICD-10-CM

## 2019-02-26 NOTE — Addendum Note (Signed)
Addended by: Alric Seton on: 02/26/2019 03:06 PM   Modules accepted: Orders

## 2019-02-26 NOTE — Progress Notes (Signed)
Subjective:  Adriana Hahn is a 28 y.o. G1P0000 at [redacted]w[redacted]d being seen today for ongoing prenatal care.  She is currently monitored for the following issues for this high-risk pregnancy and has Infertility, female; Encounter for supervision of high risk pregnancy in second trimester, antepartum; PCOS (polycystic ovarian syndrome); ASCUS of cervix with negative high risk HPV; and Diabetes mellitus ([place]) on their problem list.  GDM: Patient diet control. CBGs reviewed on babyscripts Fasting: controlled 2hr PP: controlled  Patient reports no complaints.  Contractions: Not present. Vag. Bleeding: None.  Movement: Present. Denies leaking of fluid.   The following portions of the patient's history were reviewed and updated as appropriate: allergies, current medications, past family history, past medical history, past social history, past surgical history and problem list. Problem list updated.  Objective:   Vitals:   02/26/19 1424  BP: 116/77  Pulse: 89  Weight: 150 lb (68 kg)    Fetal Status: Fetal Heart Rate (bpm): 158   Movement: Present     General:  Alert, oriented and cooperative. Patient is in no acute distress.  Skin: Skin is warm and dry. No rash noted.   Cardiovascular: Normal heart rate noted  Respiratory: Normal respiratory effort, no problems with respiration noted  Abdomen: Soft, gravid, appropriate for gestational age. Pain/Pressure: Present     Pelvic: Vag. Bleeding: None     Cervical exam deferred        Extremities: Normal range of motion.  Edema: None  Mental Status: Normal mood and affect. Normal behavior. Normal judgment and thought content.   Urinalysis:      Assessment and Plan:  Pregnancy: G1P0000 at [redacted]w[redacted]d  1. Encounter for supervision of high risk pregnancy in second trimester, antepartum FHT and FH normal Check 28 week labs Tdap  2. Type 2 diabetes mellitus without complication, unspecified whether long term insulin use ([place]) Controlled Will continue with  serial [place]  3. ASCUS of cervix with negative high risk HPV  4. PCOS (polycystic ovarian syndrome) Pregnancy resulting from IUI.  Preterm labor symptoms and general obstetric precautions including but not limited to vaginal bleeding, contractions, leaking of fluid and fetal movement were reviewed in detail with the patient. Please refer to After Visit Summary for other counseling recommendations.  No follow-ups on file.   Truett Mainland, DO

## 2019-02-27 LAB — CBC
Hematocrit: 33.5 % — ABNORMAL LOW (ref 34.0–46.6)
Hemoglobin: 11.1 g/dL (ref 11.1–15.9)
MCH: 29.9 pg (ref 26.6–33.0)
MCHC: 33.1 g/dL (ref 31.5–35.7)
MCV: 90 fL (ref 79–97)
Platelets: 284 10*3/uL (ref 150–450)
RBC: 3.71 x10E6/uL — ABNORMAL LOW (ref 3.77–5.28)
RDW: 13.6 % (ref 11.7–15.4)
WBC: 10.2 10*3/uL (ref 3.4–10.8)

## 2019-02-27 LAB — RPR: RPR Ser Ql: NONREACTIVE

## 2019-02-27 LAB — HIV ANTIBODY (ROUTINE TESTING W REFLEX): HIV Screen 4th Generation wRfx: NONREACTIVE

## 2019-03-20 ENCOUNTER — Other Ambulatory Visit (HOSPITAL_COMMUNITY): Payer: Self-pay | Admitting: *Deleted

## 2019-03-20 ENCOUNTER — Ambulatory Visit (HOSPITAL_COMMUNITY): Payer: Medicaid Other | Admitting: *Deleted

## 2019-03-20 ENCOUNTER — Ambulatory Visit (HOSPITAL_COMMUNITY)
Admission: RE | Admit: 2019-03-20 | Discharge: 2019-03-20 | Disposition: A | Discharge status: Home / Self Care | Payer: Medicaid Other | Source: Ambulatory Visit | Attending: Obstetrics and Gynecology | Admitting: Obstetrics and Gynecology

## 2019-03-20 ENCOUNTER — Other Ambulatory Visit: Payer: Self-pay

## 2019-03-20 ENCOUNTER — Encounter (HOSPITAL_COMMUNITY): Payer: Self-pay

## 2019-03-20 DIAGNOSIS — O0992 Supervision of high risk pregnancy, unspecified, second trimester: Secondary | ICD-10-CM | POA: Insufficient documentation

## 2019-03-20 DIAGNOSIS — O24415 Gestational diabetes mellitus in pregnancy, controlled by oral hypoglycemic drugs: Secondary | ICD-10-CM

## 2019-03-20 DIAGNOSIS — O24419 Gestational diabetes mellitus in pregnancy, unspecified control: Secondary | ICD-10-CM | POA: Diagnosis not present

## 2019-03-20 DIAGNOSIS — O09813 Supervision of pregnancy resulting from assisted reproductive technology, third trimester: Secondary | ICD-10-CM

## 2019-03-20 DIAGNOSIS — O24410 Gestational diabetes mellitus in pregnancy, diet controlled: Secondary | ICD-10-CM

## 2019-03-20 DIAGNOSIS — Z3A38 38 weeks gestation of pregnancy: Secondary | ICD-10-CM

## 2019-03-26 ENCOUNTER — Telehealth (INDEPENDENT_AMBULATORY_CARE_PROVIDER_SITE_OTHER): Payer: Self-pay | Admitting: Family Medicine

## 2019-03-26 ENCOUNTER — Encounter: Payer: Self-pay | Admitting: Family Medicine

## 2019-03-26 VITALS — BP 114/79 | HR 86

## 2019-03-26 DIAGNOSIS — O24812 Other pre-existing diabetes mellitus in pregnancy, second trimester: Secondary | ICD-10-CM

## 2019-03-26 DIAGNOSIS — O0992 Supervision of high risk pregnancy, unspecified, second trimester: Secondary | ICD-10-CM

## 2019-03-26 DIAGNOSIS — Z603 Acculturation difficulty: Secondary | ICD-10-CM | POA: Insufficient documentation

## 2019-03-26 DIAGNOSIS — Z3A15 15 weeks gestation of pregnancy: Secondary | ICD-10-CM

## 2019-03-26 DIAGNOSIS — E119 Type 2 diabetes mellitus without complications: Secondary | ICD-10-CM

## 2019-03-26 DIAGNOSIS — Z789 Other specified health status: Secondary | ICD-10-CM | POA: Insufficient documentation

## 2019-03-26 NOTE — Progress Notes (Signed)
I connected with  Chessa Barrasso on 03/26/19 at  9:35 AM EST by telephone and verified that I am speaking with the correct person using two identifiers.   I discussed the limitations, risks, security and privacy concerns of performing an evaluation and management service by telephone and the availability of in person appointments. I also discussed with the patient that there may be a patient responsible charge related to this service. The patient expressed understanding and agreed to proceed.  Derinda Late, RN 03/26/2019  9:45 AM

## 2019-03-26 NOTE — Patient Instructions (Signed)
Tercer trimestre de embarazo Third Trimester of Pregnancy  El tercer trimestre comprende desde la semana28 hasta la semana40 (desde el mes7 hasta el mes9). En este trimestre, el beb en gestacin (feto) crece muy rpidamente. Hacia el final del noveno mes, el beb en gestacin mide alrededor de 20pulgadas (45cm) de largo. Pesa entre 6y 10libras (2,70y 4,50kg). Siga estas indicaciones en su casa: Medicamentos  Tome los medicamentos de venta libre y los recetados solamente como se lo haya indicado el mdico. Algunos medicamentos son seguros para tomar durante el embarazo y otros no lo son.  Tome vitaminas prenatales que contengan por lo menos 600microgramos (?g) de cido flico.  Si tiene dificultad para mover el intestino (estreimiento), tome un medicamento para ablandar las heces (laxante) si su mdico se lo autoriza. Comida y bebida   Ingiera alimentos saludables de manera regular.  No coma carne cruda ni quesos sin cocinar.  Si obtiene poca cantidad de calcio de los alimentos que ingiere, consulte a su mdico sobre la posibilidad de tomar un suplemento diario de calcio.  La ingesta diaria de cuatro o cinco comidas pequeas en lugar de tres comidas abundantes.  Evite el consumo de alimentos ricos en grasas y azcares, como los alimentos fritos y los dulces.  Para evitar el estreimiento: ? Consuma alimentos ricos en fibra, como frutas y verduras frescas, cereales integrales y frijoles. ? Beba suficiente lquido para mantener el pis (orina) claro o de color amarillo plido. Actividad  Haga ejercicios solamente como se lo haya indicado el mdico. Interrumpa la actividad fsica si comienza a tener calambres.  No levante objetos pesados, use zapatos de tacones bajos y sintese derecha.  No haga ejercicio si hace demasiado calor, hay demasiada humedad o se encuentra en un lugar de mucha altura (altitud alta).  Puede continuar teniendo relaciones sexuales, a menos que el  mdico le indique lo contrario. Alivio del dolor y del malestar  Use un sostn que le brinde buen soporte si sus mamas estn sensibles.  Haga pausas frecuentes y descanse con las piernas levantadas si tiene calambres en las piernas o dolor en la zona lumbar.  Dese baos de asiento con agua tibia para aliviar el dolor o las molestias causadas por las hemorroides. Use una crema para las hemorroides si el mdico la autoriza.  Si desarrolla venas hinchadas y abultadas (vrices) en las piernas: ? Use medias de compresin o medias de descanso como se lo haya indicado el mdico. ? Levante (eleve) los pies durante 15minutos, 3 o 4veces por da. ? Limite el consumo de sal en sus alimentos. Seguridad  Colquese el cinturn de seguridad cuando conduzca.  Haga una lista de los nmeros de telfono de emergencia, que incluya los nmeros de telfono de familiares, amigos, el hospital, as como los departamentos de polica y bomberos. Preparacin para la llegada del beb Para prepararse para la llegada de su beb:  Tome clases prenatales.  Practique ir manejando al hospital.  Visite el hospital y recorra el rea de maternidad.  Hable en su trabajo acerca de tomar licencia cuando llegue el beb.  Prepare el bolso que llevar al hospital.  Prepare la habitacin del beb.  Concurra a los controles mdicos.  Compre un asiento de seguridad orientado hacia atrs para llevar al beb en el automvil. Aprenda cmo instalarlo en el auto. Instrucciones generales  No se d baos de inmersin en agua caliente, baos turcos ni saunas.  No consuma ningn producto que contenga nicotina o tabaco, como cigarrillos y cigarrillos   electrnicos. Si necesita ayuda para dejar de fumar, consulte al mdico.  No beba alcohol.  No se haga duchas vaginales ni use tampones o toallas higinicas perfumadas.  No mantenga las piernas cruzadas durante mucho tiempo.  No haga viajes de larga distancia, excepto si es  obligatorio. Hgalos solamente si su mdico la autoriza.  Visite a su dentista si no lo ha hecho durante el embarazo. Use un cepillo de cerdas suaves para cepillarse los dientes. Psese el hilo dental con suavidad.  Evite el contacto con las bandejas sanitarias de los gatos y la tierra que estos animales usan. Estos elementos contienen bacterias que pueden causar defectos congnitos al beb y la posible prdida del beb (aborto espontneo) o la muerte fetal.  Concurra a todas las visitas prenatales como se lo haya indicado el mdico. Esto es importante. Comunquese con un mdico si:  No est segura de si est en trabajo de parto o si ha roto la bolsa de las aguas.  Tiene mareos.  Tiene clicos leves o siente presin en la parte baja del vientre.  Sufre un dolor persistente en el abdomen.  Sigue teniendo malestar estomacal, vomita o tiene heces lquidas.  Advierte un lquido con olor ftido que proviene de la vagina.  Siente dolor al orinar. Solicite ayuda de inmediato si:  Tiene fiebre.  Tiene una prdida de lquido por la vagina.  Tiene sangrado o pequeas prdidas vaginales.  Siente dolor intenso o clicos en el abdomen.  Aumenta o baja de peso rpidamente.  Tiene dificultades para recuperar el aliento y siente dolor en el pecho.  Sbitamente se le hinchan mucho el rostro, las manos, los tobillos, los pies o las piernas.  No ha sentido los movimientos del beb durante una hora.  Siente un dolor de cabeza intenso que no se alivia con medicamentos.  Tiene dificultad para ver.  Tiene prdida de lquido o le sale un chorro de lquido de la vagina antes de estar en la semana 37.  Tiene espasmos abdominales (contracciones) regulares antes de estar en la semana 37. Resumen  El tercer trimestre comprende desde la semana28 hasta la semana40 (desde el mes7 hasta el mes9). Esta es la poca en que el beb en gestacin crece muy rpidamente.  Siga los consejos del mdico  con respecto a los medicamentos, la alimentacin y la actividad.  Preprese para la llegada del beb tomando las clases prenatales, preparando todo lo que necesitar el beb, arreglando la habitacin del beb y concurriendo a los controles mdicos.  Solicite ayuda de inmediato si tiene sangrado por la vagina, siente dolor en el pecho o tiene dificultad para respirar, o si no ha sentido que su beb se mueve en el transcurso de ms de una hora. Esta informacin no tiene como fin reemplazar el consejo del mdico. Asegrese de hacerle al mdico cualquier pregunta que tenga. Document Released: 11/22/2012 Document Revised: 10/25/2016 Document Reviewed: 10/25/2016 Elsevier Patient Education  2020 Elsevier Inc.  

## 2019-03-26 NOTE — Progress Notes (Signed)
   TELEHEALTH VIRTUAL OBSTETRICS VISIT ENCOUNTER NOTE  I connected with Adriana Hahn on 03/26/19 at  9:35 AM EST by telephone at home and verified that I am speaking with the correct person using two identifiers.   I discussed the limitations, risks, security and privacy concerns of performing an evaluation and management service by telephone and the availability of in person appointments. I also discussed with the patient that there may be a patient responsible charge related to this service. The patient expressed understanding and agreed to proceed.  Subjective:  Adriana Hahn is a 28 y.o. G1P0000 at [redacted]w[redacted]d being followed for ongoing prenatal care.  She is currently monitored for the following issues for this high-risk pregnancy and has Infertility, female; Encounter for supervision of high risk pregnancy in second trimester, antepartum; PCOS (polycystic ovarian syndrome); ASCUS of cervix with negative high risk HPV; and Diabetes mellitus ([place]) on their problem list.  Patient reports no complaints. Reports fetal movement. Denies any contractions, bleeding or leaking of fluid.   The following portions of the patient's history were reviewed and updated as appropriate: allergies, current medications, past family history, past medical history, past social history, past surgical history and problem list.   Objective:   General:  Alert, oriented and cooperative.   Mental Status: Normal mood and affect perceived. Normal judgment and thought content.  Rest of physical exam deferred due to type of encounter  Assessment and Plan:  Pregnancy: G1P0000 at [redacted]w[redacted]d  1. Encounter for supervision of high risk pregnancy in second trimester, antepartum - continue routine care  2. Type 2 diabetes mellitus without complication, unspecified whether long term insulin use ([place]) - Fetal echo normal 02/01/19 - CBGs reviewed. [place] in the 70s/80s  3. Language barrier affecting health care - [place], [place]  interpreter, used during this encounter  Preterm labor symptoms and general obstetric precautions including but not limited to vaginal bleeding, contractions, leaking of fluid and fetal movement were reviewed in detail with the patient.  I discussed the assessment and treatment plan with the patient. The patient was provided an opportunity to ask questions and all were answered. The patient agreed with the plan and demonstrated an understanding of the instructions. The patient was advised to call back or seek an in-person office evaluation/go to [facility]U at [facility] for any urgent or concerning symptoms. Please refer to After Visit Summary for other counseling recommendations.   I provided 12 minutes of non-face-to-face time during this encounter.  Return in about 2 weeks (around 04/09/2019) for 2 weeks HOB in person ofr fundal height.  Future Appointments  Date Time Provider [place]  04/17/2019  8:45 AM [place] NURSE [place] [facility]  04/17/2019  8:45 AM [place] 2 WH-MFCUS MFC-US  05/15/2019  8:40 AM [facility]-M[facility] NURSE [place] [facility]-US  05/15/2019  8:45 AM WH-MFC [place] 2 WH-MFCUS MFC-US    Tatyanna Cronk L Keenan Trefry, DO [facility] [employer] [facility], [place]

## 2019-03-27 ENCOUNTER — Other Ambulatory Visit: Payer: Self-pay

## 2019-03-27 ENCOUNTER — Ambulatory Visit (INDEPENDENT_AMBULATORY_CARE_PROVIDER_SITE_OTHER): Payer: Self-pay | Admitting: General Practice

## 2019-03-27 DIAGNOSIS — R8290 Unspecified abnormal findings in urine: Secondary | ICD-10-CM

## 2019-03-27 DIAGNOSIS — N898 Other specified noninflammatory disorders of vagina: Secondary | ICD-10-CM

## 2019-03-27 LAB — POCT URINALYSIS DIP (DEVICE)
Bilirubin Urine: NEGATIVE
Glucose, UA: NEGATIVE mg/dL
Hgb urine dipstick: NEGATIVE
Ketones, ur: NEGATIVE mg/dL
Leukocytes,Ua: NEGATIVE
Nitrite: NEGATIVE
Protein, ur: NEGATIVE mg/dL
Specific Gravity, Urine: 1.015 (ref 1.005–1.030)
Urobilinogen, UA: 0.2 mg/dL (ref 0.0–1.0)
pH: 7 (ref 5.0–8.0)

## 2019-03-27 NOTE — Progress Notes (Signed)
Patient presents to office today reporting urine that has a strong odor and an increase in vaginal discharge for the past 2 weeks. Patient denies vaginal itching or vaginal odor. UA negative. Will order wet prep today. Patient instructed in self swab & specimen collected. Discussed results and any needed medications will be shared via mychart. Patient verbalized understanding & had no questions. Eda used for interpreter.  Koren Bound RN BSN 03/27/19

## 2019-03-28 LAB — CERVICOVAGINAL ANCILLARY ONLY
Bacterial Vaginitis (gardnerella): NEGATIVE
Candida Glabrata: NEGATIVE
Candida Vaginitis: NEGATIVE
Comment: NEGATIVE
Comment: NEGATIVE
Comment: NEGATIVE

## 2019-03-28 NOTE — Progress Notes (Signed)
Chart reviewed for nurse visit. Agree with plan of care.   Noni Saupe I, NP 03/28/2019 8:14 AM

## 2019-04-06 NOTE — L&D Delivery Note (Signed)
OB/GYN Faculty Practice Delivery Note  Adriana Hahn is a 29 y.o. G1P0001 s/p NSVD at [redacted]w[redacted]d. She was admitted for IOL for PreE without severe features, GDMA1.   ROM: 4h 43m with clear fluid GBS Status: --Theda Sers (02/10 1037) Maximum Maternal Temperature: 98.9 F    Labor Progress: . Patient arrived at 1 cm dilation and was induced with pitocin and AROM.   Delivery Date/Time: 05/29/2019 at 0636 Delivery: Called to room and patient was complete and pushing. Head delivered in LOA position. No nuchal cord present. Shoulder and body delivered in usual fashion. Infant with spontaneous cry, placed on mother's abdomen, dried and stimulated. Cord clamped x 2 after 1-minute delay, and cut by FOB. Cord blood drawn. Placenta delivered spontaneously with gentle cord traction. Fundus firm with massage and Pitocin. Labia, perineum, vagina, and cervix inspected with bilateral periurethral and 1st degree perineal lacerations. L periurethral repaired with 4-0 Monocryl, 1st degree perineal repaired with 3-0 Vicryl.   Placenta: 3v intact, to L&D Complications: none Lacerations: bilateral periurethral and 1st degree perineal lacerations. L periurethral repaired with 4-0 Monocryl, 1st degree perineal repaired with 3-0 Vicryl EBL: 200cc Analgesia: epidural   Infant: APGAR (1 MIN): 8   APGAR (5 MINS): 9    Weight: 3140 grams  Zack Seal, MD/[facility] OB/GYN Fellow, Faculty Practice

## 2019-04-10 ENCOUNTER — Other Ambulatory Visit: Payer: Self-pay

## 2019-04-10 ENCOUNTER — Ambulatory Visit (INDEPENDENT_AMBULATORY_CARE_PROVIDER_SITE_OTHER): Payer: Self-pay | Admitting: Family Medicine

## 2019-04-10 VITALS — BP 120/88 | HR 98 | Wt 155.8 lb

## 2019-04-10 DIAGNOSIS — R8271 Bacteriuria: Secondary | ICD-10-CM

## 2019-04-10 DIAGNOSIS — Z789 Other specified health status: Secondary | ICD-10-CM

## 2019-04-10 DIAGNOSIS — Z758 Other problems related to medical facilities and other health care: Secondary | ICD-10-CM

## 2019-04-10 DIAGNOSIS — O0992 Supervision of high risk pregnancy, unspecified, second trimester: Secondary | ICD-10-CM

## 2019-04-10 DIAGNOSIS — O0993 Supervision of high risk pregnancy, unspecified, third trimester: Secondary | ICD-10-CM

## 2019-04-10 DIAGNOSIS — Z3A31 31 weeks gestation of pregnancy: Secondary | ICD-10-CM

## 2019-04-10 DIAGNOSIS — E119 Type 2 diabetes mellitus without complications: Secondary | ICD-10-CM

## 2019-04-10 DIAGNOSIS — R87610 Atypical squamous cells of undetermined significance on cytologic smear of cervix (ASC-US): Secondary | ICD-10-CM

## 2019-04-10 DIAGNOSIS — O99891 Other specified diseases and conditions complicating pregnancy: Secondary | ICD-10-CM

## 2019-04-10 NOTE — Progress Notes (Signed)
       Fleet Contras RN 04/10/19

## 2019-04-10 NOTE — Progress Notes (Signed)
   Subjective:  Adriana Hahn is a 29 y.o. G1P0000 at [redacted]w[redacted]d being seen today for ongoing prenatal care.  She is currently monitored for the following issues for this high-risk pregnancy and has Infertility, female; Encounter for supervision of high risk pregnancy in second trimester, antepartum; PCOS (polycystic ovarian syndrome); ASCUS of cervix with negative high risk HPV; Diabetes mellitus (HCC); Language barrier affecting health care; and Bacteriuria during pregnancy on their problem list.  Patient reports no bleeding, no contractions and no leaking.  Contractions: Irritability. Vag. Bleeding: None.  Movement: Present. Denies leaking of fluid.   The following portions of the patient's history were reviewed and updated as appropriate: allergies, current medications, past family history, past medical history, past social history, past surgical history and problem list. Problem list updated.  Objective:   Vitals:   04/10/19 1427  BP: 120/88  Pulse: 98  Weight: 155 lb 12.8 oz (70.7 kg)    Fetal Status: Fetal Heart Rate (bpm): 138   Movement: Present     General:  Alert, oriented and cooperative. Patient is in no acute distress.  Skin: Skin is warm and dry. No rash noted.   Cardiovascular: Normal heart rate noted  Respiratory: Normal respiratory effort, no problems with respiration noted  Abdomen: Soft, gravid, appropriate for gestational age. Pain/Pressure: Absent     Pelvic: Vag. Bleeding: None     Cervical exam deferred        Extremities: Normal range of motion.  Edema: None  Mental Status: Normal mood and affect. Normal behavior. Normal judgment and thought content.   Urinalysis:      Assessment and Plan:  Pregnancy: G1P0000 at [redacted]w[redacted]d  1. Encounter for supervision of high risk pregnancy in second trimester, antepartum Stable Reports noticing bump in R axilla, small subcentimeter lymph node palpated, reassured of benign etiology RTC in 2 weeks  2. Type 2 diabetes mellitus  without complication, unspecified whether long term insulin use (HCC) Questionable diagnosis with normal A1c of 5.2% and one minimally elevated 2hr GTT as well as superb control with only diet 100% of sugars at goal today Unremarkable Pedi Echo already completed Engaged in regular growth scans, reviewed w Dr. Grace Bushy, given excellent control will cont growth scans but defer weekly antenatal testing at present  3. ASCUS of cervix with negative high risk HPV Repeat cotesting at 3 years  4. Language barrier affecting health care Spanish  5. Bacteriuria during pregnancy Ecoli on initial prenatal labs, report she took abx as prescribed at that time Reports no symptoms currently TOC ordered today - Culture, OB Urine  Preterm labor symptoms and general obstetric precautions including but not limited to vaginal bleeding, contractions, leaking of fluid and fetal movement were reviewed in detail with the patient. Please refer to After Visit Summary for other counseling recommendations.  Return in 2 weeks (on 04/24/2019) for [facility], ob visit.   Venora Maples, MD

## 2019-04-10 NOTE — Patient Instructions (Signed)
 Eleccin del mtodo anticonceptivo Contraception Choices La anticoncepcin, o los mtodos anticonceptivos, hace referencia a los mtodos o dispositivos que evitan el embarazo. Mtodos hormonales Implante anticonceptivo  Un implante anticonceptivo consiste en un tubo plstico delgado que contiene una hormona. Se inserta en la parte superior del brazo. Puede permanecer en el lugar hasta por 3 aos. Inyecciones de progestina sola Las inyecciones de progestina sola contienen progestina, una forma sinttica de la hormona progesterona. Un mdico las administra cada 3 meses. Pldoras anticonceptivas  Las pldoras anticonceptivas son pastillas que contienen hormonas que evitan el embarazo. Deben tomarse una vez al da, preferentemente a la misma hora cada da. Parches anticonceptivos  El parche anticonceptivo contiene hormonas que evitan el embarazo. Se coloca en la piel, debe cambiarse una vez a la semana durante tres semanas y debe retirarse en la cuarta semana. Se necesita una receta para utilizar este mtodo anticonceptivo. Anillo vaginal  Un anillo vaginal contiene hormonas que evitan el embarazo. Se coloca en la vagina durante tres semanas y se retira en la cuarta semana. Luego se repite el proceso con un anillo nuevo. Se necesita una receta para utilizar este mtodo anticonceptivo. Anticonceptivo de emergencia Los anticonceptivos de emergencia son mtodos para evitar un embarazo despus de tener sexo sin proteccin. Vienen en forma de pldora y pueden tomarse hasta 5 das despus de tener sexo. Funcionan mejor cuando se toman lo ms pronto posible luego de tener sexo. La mayora de los anticonceptivos de emergencia estn disponibles sin receta mdica. Este mtodo no debe utilizarse como el nico mtodo anticonceptivo. Mtodos de barrera Preservativo masculino  Un preservativo masculino es una vaina delgada que se coloca sobre el pene durante el sexo. Los preservativos evitan que el esperma  ingrese en el cuerpo de la mujer. Pueden utilizarse con un espermicida para aumentar la efectividad. Deben desecharse luego de su uso. Preservativo femenino  Un preservativo femenino es una vaina blanda y holgada que se coloca en la vagina antes de tener sexo. El preservativo evita que el esperma ingrese en el cuerpo de la mujer. Deben desecharse luego de su uso. Diafragma  Un diafragma es una barrera blanda con forma de cpula. Se inserta en la vagina antes del sexo, junto con un espermicida. El diafragma bloquea el ingreso de esperma en el tero, y el espermicida mata a los espermatozoides. El diafragma debe permanecer en la vagina durante 6 a 8 horas despus de tener sexo y debe retirarse en el plazo de las 24 horas. Un diafragma es recetado y colocado por un mdico. Debe reemplazarse cada 1 a 2 aos, despus de dar a luz, de aumentar ms de 15lb (6,8kg) y de una ciruga plvica. Capuchn cervical  Un capuchn cervical es una copa redonda y blanda de ltex o plstico que se coloca en el cuello uterino. Se inserta en la vagina antes del sexo, junto con un espermicida. Bloquea el ingreso del esperma en el tero. El capuchn debe permanecer en el lugar durante 6 a 8 horas despus de tener sexo y debe retirarse en el plazo de las 48 horas. Un capuchn cervical debe ser recetado y colocado por un mdico. Debe reemplazarse cada 2aos. Esponja  Una esponja es una pieza blanda y circular de espuma de poliuretano que contiene espermicida. La esponja ayuda a bloquear el ingreso de esperma en el tero, y el espermicida mata a los espermatozoides. Para utilizarla, debe humedecerla e insertarla en la vagina. Debe insertarse antes de tener sexo, debe permanecer dentro al menos   durante 6 horas despus de tener sexo y debe retirarse y desecharse en el plazo de las 30 horas. Espermicidas Los espermicidas son sustancias qumicas que matan o bloquean al esperma y no lo dejan ingresar al cuello uterino y al tero.  Vienen en forma de crema, gel, supositorio, espuma o comprimido. Un espermicida debe insertarse en la vagina con un aplicador al menos 10 o 15 minutos antes de tener sexo para dar tiempo a que surta efecto. El proceso debe repetirse cada vez que tenga sexo. Los espermicidas no requieren receta mdica. Anticonceptivos intrauterinos Dispositivo intrauterino (DIU). Un DIU es un dispositivo en forma de T que se coloca en el tero. Existen dos tipos:  DIU hormonal.Este tipo contiene progestina, una forma sinttica de la hormona progesterona. Este tipo puede permanecer colocado durante 3 a 5 aos.  DIU de cobre.Este tipo est recubierto con un alambre de cobre. Puede permanecer colocado durante 10 aos.  Mtodos anticonceptivos permanentes Ligadura de trompas en la mujer En este mtodo, se sellan, atan u obstruyen las trompas de Falopio durante una ciruga para evitar que el vulo descienda hacia el tero. Esterilizacin histeroscpica En este mtodo, se coloca un implante pequeo y flexible dentro de cada trompa de Falopio. Los implantes hacen que se forme un tejido cicatricial en las trompas de Falopio y que las obstruya para que el espermatozoide no pueda llegar al vulo. El procedimiento demora alrededor de 3 meses para que sea efectivo. Debe utilizarse otro mtodo anticonceptivo durante esos 3 meses. Esterilizacin masculina Este es un procedimiento que consiste en atar los conductos que transportan el esperma (vasectoma). Luego del procedimiento, el hombre puede eyacular lquido (semen). Mtodos de planificacin natural Planificacin familiar natural En este mtodo, la pareja no tiene sexo durante los das en que la mujer podra quedar embarazada. Mtodo calendario Esto significa realizar un seguimiento de la duracin de cada ciclo menstrual, identificar los das en los que se puede producir un embarazo y no tener sexo durante esos das. Mtodo de la ovulacin En este mtodo, la pareja evita  tener sexo durante la ovulacin. Mtodo sintotrmico Este mtodo implica no tener sexo durante la ovulacin. Normalmente, la mujer comprueba la ovulacin al observar cambios en su temperatura y en la consistencia del moco cervical. Mtodo posovulacin En este mtodo, la pareja espera a que finalice la ovulacin para tener sexo. Resumen  La anticoncepcin, o los mtodos anticonceptivos, hace referencia a los mtodos o dispositivos que evitan el embarazo.  Los mtodos anticonceptivos hormonales incluyen implantes, inyecciones, pastillas, parches, anillos vaginales y anticonceptivos de emergencia.  Los mtodos anticonceptivos de barrera pueden incluir preservativos masculinos, preservativos femeninos, diafragmas, capuchones cervicales, esponjas y espermicidas.  Existen dos tipos de DIU (dispositivo intrauterino). Un DIU puede colocarse en el tero de una mujer para evitar el embarazo durante 3 a 5 aos.  La esterilizacin permanente puede realizarse mediante un procedimiento para hombres, mujeres o ambos.  Los mtodos de planificacin familiar natural incluyen no tener sexo durante los das en que la mujer podra quedar embarazada. Esta informacin no tiene como fin reemplazar el consejo del mdico. Asegrese de hacerle al mdico cualquier pregunta que tenga. Document Revised: 04/23/2017 Document Reviewed: 07/12/2016 Elsevier Patient Education  2020 [employer]   Lactancia materna Breastfeeding  Decidir amamantar es una de las mejores elecciones que puede hacer por usted y su beb. Un cambio en las hormonas durante el embarazo hace que las mamas produzcan leche materna en las glndulas productoras de leche. Las hormonas impiden que la leche   materna sea liberada antes del nacimiento del beb. Adems, impulsan el flujo de leche luego del nacimiento. Una vez que ha comenzado a amamantar, pensar en el beb, as como la succin o el llanto, pueden estimular la liberacin de leche de las  glndulas productoras de leche. Los beneficios de amamantar Las investigaciones demuestran que la lactancia materna ofrece muchos beneficios de salud para bebs y madres. Adems, ofrece una forma gratuita y conveniente de alimentar al beb. Para el beb  La primera leche (calostro) ayuda a mejorar el funcionamiento del aparato digestivo del beb.  Las clulas especiales de la leche (anticuerpos) ayudan a combatir las infecciones en el beb.  Los bebs que se alimentan con leche materna tambin tienen menos probabilidades de tener asma, alergias, obesidad o diabetes de tipo 2. Adems, tienen menor riesgo de sufrir el sndrome de muerte sbita del lactante (SMSL).  Los nutrientes de la leche materna son mejores para satisfacer las necesidades del beb en comparacin con la leche maternizada.  La leche materna mejora el desarrollo cerebral del beb. Para usted  La lactancia materna favorece el desarrollo de un vnculo muy especial entre la madre y el beb.  Es conveniente. La leche materna es econmica y siempre est disponible a la temperatura correcta.  La lactancia materna ayuda a quemar caloras. Le ayuda a perder el peso ganado durante el embarazo.  Hace que el tero vuelva al tamao que tena antes del embarazo ms rpido. Adems, disminuye el sangrado (loquios) despus del parto.  La lactancia materna contribuye a reducir el riesgo de tener diabetes de tipo 2, osteoporosis, artritis reumatoide, enfermedades cardiovasculares y cncer de mama, ovario, tero y endometrio en el futuro. Informacin bsica sobre la lactancia Comienzo de la lactancia  Encuentre un lugar cmodo para sentarse o acostarse, con un buen respaldo para el cuello y la espalda.  Coloque una almohada o una manta enrollada debajo del beb para acomodarlo a la altura de la mama (si est sentada). Las almohadas para amamantar se han diseado especialmente a fin de servir de apoyo para los brazos y el beb mientras  amamanta.  Asegrese de que la barriga del beb (abdomen) est frente a la suya.  Masajee suavemente la mama. Con las yemas de los dedos, masajee los bordes exteriores de la mama hacia adentro, en direccin al pezn. Esto estimula el flujo de leche. Si la leche fluye lentamente, es posible que deba continuar con este movimiento durante la lactancia.  Sostenga la mama con 4 dedos por debajo y el pulgar por arriba del pezn (forme la letra "C" con la mano). Asegrese de que los dedos se encuentren lejos del pezn y de la boca del beb.  Empuje suavemente los labios del beb con el pezn o con el dedo.  Cuando la boca del beb se abra lo suficiente, acrquelo rpidamente a la mama e introduzca todo el pezn y la arola, tanto como sea posible, dentro de la boca del beb. La arola es la zona de color que rodea al pezn. ? Debe haber ms arola visible por arriba del labio superior del beb que por debajo del labio inferior. ? Los labios del beb deben estar abiertos y extendidos hacia afuera (evertidos) para asegurar que el beb se prenda de forma adecuada y cmoda. ? La lengua del beb debe estar entre la enca inferior y la mama.  Asegrese de que la boca del beb est en la posicin correcta alrededor del pezn (prendido). Los labios del beb deben crear   un sello sobre la mama y estar doblados hacia afuera (invertidos).  Es comn que el beb succione durante 2 a 3 minutos para que comience el flujo de leche materna. Cmo debe prenderse Es muy importante que le ensee al beb cmo prenderse adecuadamente a la mama. Si el beb no se prende adecuadamente, puede causar dolor en los pezones, reducir la produccin de leche materna y hacer que el beb tenga un escaso aumento de peso. Adems, si el beb no se prende adecuadamente al pezn, puede tragar aire durante la alimentacin. Esto puede causarle molestias al beb. Hacer eructar al beb al cambiar de mama puede ayudarlo a liberar el aire. Sin  embargo, ensearle al beb cmo prenderse a la mama adecuadamente es la mejor manera de evitar que se sienta molesto por tragar aire mientras se alimenta. Signos de que el beb se ha prendido adecuadamente al pezn  Tironea o succiona de modo silencioso, sin causarle dolor. Los labios del beb deben estar extendidos hacia afuera (evertidos).  Se escucha que traga cada 3 o 4 succiones una vez que la leche ha comenzado a fluir (despus de que se produzca el reflejo de eyeccin de la leche).  Hay movimientos musculares por arriba y por delante de sus odos al succionar. Signos de que el beb no se ha prendido adecuadamente al pezn  Hace ruidos de succin o de chasquido mientras se alimenta.  Siente dolor en los pezones. Si cree que el beb no se prendi correctamente, deslice el dedo en la comisura de la boca y colquelo entre las encas del beb para interrumpir la succin. Intente volver a comenzar a amamantar. Signos de lactancia materna exitosa Signos del beb  El beb disminuir gradualmente el nmero de succiones o dejar de succionar por completo.  El beb se quedar dormido.  El cuerpo del beb se relajar.  El beb retendr una pequea cantidad de leche en la boca.  El beb se desprender solo del pecho. Signos que presenta usted  Las mamas han aumentado la firmeza, el peso y el tamao 1 a 3 horas despus de amamantar.  Estn ms blandas inmediatamente despus de amamantar.  Se producen un aumento del volumen de leche y un cambio en su consistencia y color hacia el quinto da de lactancia.  Los pezones no duelen, no estn agrietados ni sangran. Signos de que su beb recibe la cantidad de leche suficiente  Mojar por lo menos 1 o 2paales durante las primeras 24horas despus del nacimiento.  Mojar por lo menos 5 o 6paales cada 24horas durante la primera semana despus del nacimiento. La orina debe ser clara o de color amarillo plido a los 5das de vida.  Mojar  entre 6 y 8paales cada 24horas a medida que el beb sigue creciendo y desarrollndose.  Defeca por lo menos 3 veces en 24 horas a los 5 das de vida. Las heces deben ser blandas y amarillentas.  Defeca por lo menos 3 veces en 24 horas a los 7 das de vida. Las heces deben ser grumosas y amarillentas.  No registra una prdida de peso mayor al 10% del peso al nacer durante los primeros 3 das de vida.  Aumenta de peso un promedio de 4 a 7onzas (113 a 198g) por semana despus de los 4 das de vida.  Aumenta de peso, diariamente, de manera uniforme a partir de los 5 das de vida, sin registrar prdida de peso despus de las 2semanas de vida. Despus de alimentarse, es posible que   el beb regurgite una pequea cantidad de leche. Esto es normal. Frecuencia y duracin de la lactancia El amamantamiento frecuente la ayudar a producir ms leche y puede prevenir dolores en los pezones y las mamas extremadamente llenas (congestin mamaria). Alimente al beb cuando muestre signos de hambre o si siente la necesidad de reducir la congestin de las mamas. Esto se denomina "lactancia a demanda". Las seales de que el beb tiene hambre incluyen las siguientes:  Aumento del estado de alerta, actividad o inquietud.  Mueve la cabeza de un lado a otro.  Abre la boca cuando se le toca la mejilla o la comisura de la boca (reflejo de bsqueda).  Aumenta las vocalizaciones, tales como sonidos de succin, se relame los labios, emite arrullos, suspiros o chirridos.  Mueve la mano hacia la boca y se chupa los dedos o las manos.  Est molesto o llora. Evite el uso del chupete en las primeras 4 a 6 semanas despus del nacimiento del beb. Despus de este perodo, podr usar un chupete. Las investigaciones demostraron que el uso del chupete durante el primer ao de vida del beb disminuye el riesgo de tener el sndrome de muerte sbita del lactante (SMSL). Permita que el nio se alimente en cada mama todo lo que  desee. Cuando el beb se desprende o se queda dormido mientras se est alimentando de la primera mama, ofrzcale la segunda. Debido a que, con frecuencia, los recin nacidos estn somnolientos las primeras semanas de vida, es posible que deba despertar al beb para alimentarlo. Los horarios de lactancia varan de un beb a otro. Sin embargo, las siguientes reglas pueden servir como gua para ayudarla a garantizar que el beb se alimenta adecuadamente:  Se puede amamantar a los recin nacidos (bebs de 4 semanas o menos de vida) cada 1 a 3 horas.  No deben transcurrir ms de 3 horas durante el da o 5 horas durante la noche sin que se amamante a los recin nacidos.  Debe amamantar al beb un mnimo de 8 veces en un perodo de 24 horas. Extraccin de leche materna     La extraccin y el almacenamiento de la leche materna le permiten asegurarse de que el beb se alimente exclusivamente de su leche materna, aun en momentos en los que no puede amamantar. Esto tiene especial importancia si debe regresar al trabajo en el perodo en que an est amamantando o si no puede estar presente en los momentos en que el beb debe alimentarse. Su asesor en lactancia puede ayudarla a encontrar un mtodo de extraccin que funcione mejor para usted y orientarla sobre cunto tiempo es seguro almacenar leche materna. Cmo cuidar las mamas durante la lactancia Los pezones pueden secarse, agrietarse y doler durante la lactancia. Las siguientes recomendaciones pueden ayudarla a mantener las mamas humectadas y sanas:  Evite usar jabn en los pezones.  Use un sostn de soporte diseado especialmente para la lactancia materna. Evite usar sostenes con aro o sostenes muy ajustados (sostenes deportivos).  Seque al aire sus pezones durante 3 a 4minutos despus de amamantar al beb.  Utilice solo apsitos de algodn en el sostn para absorber las prdidas de leche. La prdida de un poco de leche materna entre las tomas es  normal.  Utilice lanolina sobre los pezones luego de amamantar. La lanolina ayuda a mantener la humedad normal de la piel. La lanolina pura no es perjudicial (no es txica) para el beb. Adems, puede extraer manualmente algunas gotas de leche materna y masajear   suavemente esa leche sobre los pezones para que la leche se seque al aire. Durante las primeras semanas despus del nacimiento, algunas mujeres experimentan congestin mamaria. La congestin mamaria puede hacer que sienta las mamas pesadas, calientes y sensibles al tacto. El pico de la congestin mamaria ocurre en el plazo de los 3 a 5 das despus del parto. Las siguientes recomendaciones pueden ayudarla a aliviar la congestin mamaria:  Vace por completo las mamas al amamantar o extraer leche. Puede aplicar calor hmedo en las mamas (en la ducha o con toallas hmedas para manos) antes de amamantar o extraer leche. Esto aumenta la circulacin y ayuda a que la leche fluya. Si el beb no vaca por completo las mamas cuando lo amamanta, extraiga la leche restante despus de que haya finalizado.  Aplique compresas de hielo sobre las mamas inmediatamente despus de amamantar o extraer leche, a menos que le resulte demasiado incmodo. Haga lo siguiente: ? Ponga el hielo en una bolsa plstica. ? Coloque una toalla entre la piel y la bolsa de hielo. ? Coloque el hielo durante 20minutos, 2 o 3veces por da.  Asegrese de que el beb est prendido y se encuentre en la posicin correcta mientras lo alimenta. Si la congestin mamaria persiste luego de 48 horas o despus de seguir estas recomendaciones, comunquese con su mdico o un asesor en lactancia. Recomendaciones de salud general durante la lactancia  Consuma 3 comidas y 3 colaciones saludables todos los das. Las madres bien alimentadas que amamantan necesitan entre 450 y 500 caloras adicionales por da. Puede cumplir con este requisito al aumentar la cantidad de una dieta equilibrada que  realice.  Beba suficiente agua para mantener la orina clara o de color amarillo plido.  Descanse con frecuencia, reljese y siga tomando sus vitaminas prenatales para prevenir la fatiga, el estrs y los niveles bajos de vitaminas y minerales en el cuerpo (deficiencias de nutrientes).  No consuma ningn producto que contenga nicotina o tabaco, como cigarrillos y cigarrillos electrnicos. El beb puede verse afectado por las sustancias qumicas de los cigarrillos que pasan a la leche materna y por la exposicin al humo ambiental del tabaco. Si necesita ayuda para dejar de fumar, consulte al mdico.  Evite el consumo de alcohol.  No consuma drogas ilegales o marihuana.  Antes de usar cualquier medicamento, hable con el mdico. Estos incluyen medicamentos recetados y de venta libre, como tambin vitaminas y suplementos a base de hierbas. Algunos medicamentos, que pueden ser perjudiciales para el beb, pueden pasar a travs de la leche materna.  Puede quedar embarazada durante la lactancia. Si se desea un mtodo anticonceptivo, consulte al mdico sobre cules son las opciones seguras durante la lactancia. Dnde encontrar ms informacin: Liga internacional [employer]eche: [email or web address]. Comunquese con un mdico si:  Siente que quiere dejar de amamantar o se siente frustrada con la lactancia.  Sus pezones estn agrietados o sangran.  Sus mamas estn irritadas, sensibles o calientes.  Tiene los siguientes sntomas: ? Dolor en las mamas o en los pezones. ? Un rea hinchada en cualquiera de las mamas. ? Fiebre o escalofros. ? Nuseas o vmitos. ? Drenaje de otro lquido distinto de la leche materna desde los pezones.  Sus mamas no se llenan antes de amamantar al beb para el quinto da despus del parto.  Se siente triste y deprimida.  El beb: ? Est demasiado somnoliento como para comer bien. ? Tiene problemas para dormir. ? Tiene ms de 1 semana de vida y moja menos de   6 paales en un  periodo de 24 horas. ? No ha aumentado de peso a los 5 das de vida.  El beb defeca menos de 3 veces en 24 horas.  La piel del beb o las partes blancas de los ojos se vuelven amarillentas. Solicite ayuda de inmediato si:  El beb est muy cansado (letargo) y no se quiere despertar para comer.  Le sube la fiebre sin causa. Resumen  La lactancia materna ofrece muchos beneficios de salud para bebs y madres.  Intente amamantar a su beb cuando muestre signos tempranos de hambre.  Haga cosquillas o empuje suavemente los labios del beb con el dedo o el pezn para lograr que el beb abra la boca. Acerque el beb a la mama. Asegrese de que la mayor parte de la arola se encuentre dentro de la boca del beb. Ofrzcale una mama y haga eructar al beb antes de pasar a la otra.  Hable con su mdico o asesor en lactancia si tiene dudas o problemas con la lactancia. Esta informacin no tiene como fin reemplazar el consejo del mdico. Asegrese de hacerle al mdico cualquier pregunta que tenga. Document Revised: 06/16/2017 Document Reviewed: 07/12/2016 Elsevier Patient Education  2020 [employer]  

## 2019-04-13 LAB — CULTURE, OB URINE

## 2019-04-13 LAB — URINE CULTURE, OB REFLEX

## 2019-04-17 ENCOUNTER — Ambulatory Visit (HOSPITAL_COMMUNITY): Payer: Medicaid Other | Admitting: *Deleted

## 2019-04-17 ENCOUNTER — Encounter (HOSPITAL_COMMUNITY): Payer: Self-pay

## 2019-04-17 ENCOUNTER — Ambulatory Visit (HOSPITAL_COMMUNITY)
Admission: RE | Admit: 2019-04-17 | Discharge: 2019-04-17 | Disposition: A | Discharge status: Home / Self Care | Payer: Medicaid Other | Source: Ambulatory Visit | Attending: Maternal & Fetal Medicine | Admitting: Maternal & Fetal Medicine

## 2019-04-17 ENCOUNTER — Other Ambulatory Visit (HOSPITAL_COMMUNITY): Payer: Self-pay | Admitting: *Deleted

## 2019-04-17 ENCOUNTER — Other Ambulatory Visit: Payer: Self-pay

## 2019-04-17 DIAGNOSIS — O24410 Gestational diabetes mellitus in pregnancy, diet controlled: Secondary | ICD-10-CM

## 2019-04-17 DIAGNOSIS — Z3A32 32 weeks gestation of pregnancy: Secondary | ICD-10-CM

## 2019-04-17 DIAGNOSIS — Z362 Encounter for other antenatal screening follow-up: Secondary | ICD-10-CM

## 2019-04-17 DIAGNOSIS — O0992 Supervision of high risk pregnancy, unspecified, second trimester: Secondary | ICD-10-CM | POA: Insufficient documentation

## 2019-04-17 DIAGNOSIS — O09813 Supervision of pregnancy resulting from assisted reproductive technology, third trimester: Secondary | ICD-10-CM

## 2019-04-17 DIAGNOSIS — O24415 Gestational diabetes mellitus in pregnancy, controlled by oral hypoglycemic drugs: Secondary | ICD-10-CM

## 2019-04-24 ENCOUNTER — Other Ambulatory Visit: Payer: Self-pay

## 2019-04-24 ENCOUNTER — Ambulatory Visit (INDEPENDENT_AMBULATORY_CARE_PROVIDER_SITE_OTHER): Payer: Self-pay | Admitting: Family Medicine

## 2019-04-24 VITALS — BP 121/86 | HR 91 | Wt 158.7 lb

## 2019-04-24 DIAGNOSIS — O0993 Supervision of high risk pregnancy, unspecified, third trimester: Secondary | ICD-10-CM

## 2019-04-24 DIAGNOSIS — Z789 Other specified health status: Secondary | ICD-10-CM

## 2019-04-24 DIAGNOSIS — O0992 Supervision of high risk pregnancy, unspecified, second trimester: Secondary | ICD-10-CM

## 2019-04-24 DIAGNOSIS — O24410 Gestational diabetes mellitus in pregnancy, diet controlled: Secondary | ICD-10-CM

## 2019-04-24 DIAGNOSIS — O99891 Other specified diseases and conditions complicating pregnancy: Secondary | ICD-10-CM

## 2019-04-24 DIAGNOSIS — R8271 Bacteriuria: Secondary | ICD-10-CM

## 2019-04-24 DIAGNOSIS — R87610 Atypical squamous cells of undetermined significance on cytologic smear of cervix (ASC-US): Secondary | ICD-10-CM

## 2019-04-24 DIAGNOSIS — Z3A33 33 weeks gestation of pregnancy: Secondary | ICD-10-CM

## 2019-04-24 NOTE — Progress Notes (Signed)
   PRENATAL VISIT NOTE  Subjective:  Adriana Hahn is a 29 y.o. G1P0000 at [redacted]w[redacted]d being seen today for ongoing prenatal care.  She is currently monitored for the following issues for this high-risk pregnancy and has Infertility, female; Encounter for supervision of high risk pregnancy in second trimester, antepartum; PCOS (polycystic ovarian syndrome); ASCUS of cervix with negative high risk HPV; Language barrier affecting health care; and Bacteriuria during pregnancy on their problem list.  Patient reports intermittent pelvic pain but otherwise denies complaints.  Contractions: Not present. Vag. Bleeding: None.  Movement: Present. Denies leaking of fluid.   The following portions of the patient's history were reviewed and updated as appropriate: allergies, current medications, past family history, past medical history, past social history, past surgical history and problem list.   Objective:   Vitals:   04/24/19 1531  BP: 121/86  Pulse: 91  Weight: 158 lb 11.2 oz (72 kg)    Fetal Status: Fetal Heart Rate (bpm):  149 Fundal Height: 34 cm Movement: Present     General:  Alert, oriented and cooperative. Patient is in no acute distress.  Skin: Skin is warm and dry. No rash noted.   Cardiovascular: Normal heart rate noted  Respiratory: Normal respiratory effort, no problems with respiration noted  Abdomen: Soft, gravid, appropriate for gestational age.  Pain/Pressure: Present     Pelvic: Cervical exam deferred        Extremities: Normal range of motion.  Edema: None  Mental Status: Normal mood and affect. Normal behavior. Normal judgment and thought content.   Assessment and Plan:  Pregnancy: G1P0000 at [redacted]w[redacted]d 1. Encounter for supervision of high risk pregnancy in second trimester, antepartum RTC in 2 weeks  Weekly antenatal testing starting at 36w Girl/breast/none  2. GDMA1 Questionable diagnosis with normal A1c of 5.2% and one minimally elevated 2hr GTT  All sugars at goal  Fetal  Echo WNL Antenatal testing weekly at 36w  3. ASCUS of cervix with negative high risk HPV Repeat cotesting at 3 years  4. Language barrier affecting health care Spanish  5. Bacteriuria during pregnancy Ecoli on initial prenatal labs, report she took abx as prescribed at that time Reports no symptoms currently TOC ordered at last visit and will repeat again today as likely contaminant   Preterm labor symptoms and general obstetric precautions including but not limited to vaginal bleeding, contractions, leaking of fluid and fetal movement were reviewed in detail with the patient. Please refer to After Visit Summary for other counseling recommendations.   Return in about 2 weeks (around 05/08/2019) for [facility]; in-person.  Future Appointments  Date Time Provider Department Center  05/15/2019  8:40 AM [facility] NURSE [facility]-US  05/15/2019  8:45 AM [facility] [place] 2 [facility]-MFC[facility] M[facility]-US  05/22/2019  7:45 AM [facility] NURSE [facility]  05/22/2019  7:45 AM [facility]FC [place] [facility]-MFCUS MFC-US  05/29/2019  7:45 AM [facility] NURSE [facility]  05/29/2019  7:45 AM [facility] [place] 2 [facility]-MFC[facility] MFC-US    Joselyn Arrow, MD

## 2019-04-24 NOTE — Progress Notes (Signed)
BRx Glucose readings:       

## 2019-04-26 LAB — URINE CULTURE

## 2019-05-09 ENCOUNTER — Ambulatory Visit (INDEPENDENT_AMBULATORY_CARE_PROVIDER_SITE_OTHER): Payer: Self-pay | Admitting: Obstetrics and Gynecology

## 2019-05-09 ENCOUNTER — Encounter: Payer: Self-pay | Admitting: Obstetrics and Gynecology

## 2019-05-09 ENCOUNTER — Other Ambulatory Visit: Payer: Self-pay

## 2019-05-09 VITALS — BP 130/87 | HR 83 | Wt 160.0 lb

## 2019-05-09 DIAGNOSIS — O0992 Supervision of high risk pregnancy, unspecified, second trimester: Secondary | ICD-10-CM

## 2019-05-09 DIAGNOSIS — O24410 Gestational diabetes mellitus in pregnancy, diet controlled: Secondary | ICD-10-CM

## 2019-05-09 DIAGNOSIS — Z789 Other specified health status: Secondary | ICD-10-CM

## 2019-05-09 DIAGNOSIS — Z3A35 35 weeks gestation of pregnancy: Secondary | ICD-10-CM

## 2019-05-09 DIAGNOSIS — O24419 Gestational diabetes mellitus in pregnancy, unspecified control: Secondary | ICD-10-CM | POA: Insufficient documentation

## 2019-05-09 NOTE — Progress Notes (Signed)
Prenatal Visit Note Date: 05/09/2019 Clinic: [facility]  Subjective:  Adriana Hahn is a 29 y.o. G1P0000 at [redacted]w[redacted]d being seen today for ongoing prenatal care.  She is currently monitored for the following issues for this high-risk pregnancy and has Infertility, female; Encounter for supervision of high risk pregnancy in second trimester, antepartum; PCOS (polycystic ovarian syndrome); ASCUS of cervix with negative high risk HPV; Language barrier affecting health care; Bacteriuria during pregnancy; and Gestational diabetes on their problem list.  Patient reports no complaints.   Contractions: Irregular. Vag. Bleeding: None.  Movement: Present. Denies leaking of fluid.   The following portions of the patient's history were reviewed and updated as appropriate: allergies, current medications, past family history, past medical history, past social history, past surgical history and problem list. Problem list updated.  Objective:   Vitals:   05/09/19 0852  BP: 130/87  Pulse: 83  Weight: 160 lb (72.6 kg)    Fetal Status: Fetal Heart Rate (bpm): 142 Fundal Height: 37 cm Movement: Present     General:  Alert, oriented and cooperative. Patient is in no acute distress.  Skin: Skin is warm and dry. No rash noted.   Cardiovascular: Normal heart rate noted  Respiratory: Normal respiratory effort, no problems with respiration noted  Abdomen: Soft, gravid, appropriate for gestational age. Pain/Pressure: Present     Pelvic:  Cervical exam deferred        Extremities: Normal range of motion.  Edema: Trace  Mental Status: Normal mood and affect. Normal behavior. Normal judgment and thought content.   Urinalysis:      Assessment and Plan:  Pregnancy: G1P0000 at [redacted]w[redacted]d  1. Encounter for supervision of high risk pregnancy in third trimester, antepartum Routine care. gbs nv - [place] MFM FETAL BPP WO NON STRESS; Future  2. Diet controlled gestational diabetes mellitus (GDM) in third  trimester Great CBGs. Growth and bpp qwk starting nv per mfm  3. Language barrier affecting health care Interpreter used  Preterm labor symptoms and general obstetric precautions including but not limited to vaginal bleeding, contractions, leaking of fluid and fetal movement were reviewed in detail with the patient. Please refer to After Visit Summary for other counseling recommendations.  Return in about 6 days (around 05/15/2019).   [place] Bing, MD

## 2019-05-15 ENCOUNTER — Other Ambulatory Visit: Payer: Self-pay

## 2019-05-15 ENCOUNTER — Ambulatory Visit (HOSPITAL_COMMUNITY): Payer: Medicaid Other | Admitting: *Deleted

## 2019-05-15 ENCOUNTER — Other Ambulatory Visit (HOSPITAL_COMMUNITY): Payer: Self-pay | Admitting: *Deleted

## 2019-05-15 ENCOUNTER — Ambulatory Visit (HOSPITAL_COMMUNITY)
Admission: RE | Admit: 2019-05-15 | Discharge: 2019-05-15 | Disposition: A | Discharge status: Home / Self Care | Payer: Medicaid Other | Source: Ambulatory Visit | Attending: Maternal & Fetal Medicine | Admitting: Maternal & Fetal Medicine

## 2019-05-15 ENCOUNTER — Encounter (HOSPITAL_COMMUNITY): Payer: Self-pay

## 2019-05-15 VITALS — BP 121/87 | HR 79 | Temp 97.9°F

## 2019-05-15 DIAGNOSIS — O0992 Supervision of high risk pregnancy, unspecified, second trimester: Secondary | ICD-10-CM | POA: Insufficient documentation

## 2019-05-15 DIAGNOSIS — O24410 Gestational diabetes mellitus in pregnancy, diet controlled: Secondary | ICD-10-CM

## 2019-05-15 DIAGNOSIS — O09813 Supervision of pregnancy resulting from assisted reproductive technology, third trimester: Secondary | ICD-10-CM | POA: Diagnosis not present

## 2019-05-15 DIAGNOSIS — O24415 Gestational diabetes mellitus in pregnancy, controlled by oral hypoglycemic drugs: Secondary | ICD-10-CM | POA: Diagnosis not present

## 2019-05-15 DIAGNOSIS — Z362 Encounter for other antenatal screening follow-up: Secondary | ICD-10-CM

## 2019-05-15 DIAGNOSIS — Z3A36 36 weeks gestation of pregnancy: Secondary | ICD-10-CM

## 2019-05-16 ENCOUNTER — Other Ambulatory Visit (HOSPITAL_COMMUNITY)
Admission: RE | Admit: 2019-05-16 | Discharge: 2019-05-16 | Disposition: A | Discharge status: Home / Self Care | Payer: Medicaid Other | Source: Ambulatory Visit | Attending: Obstetrics & Gynecology | Admitting: Obstetrics & Gynecology

## 2019-05-16 ENCOUNTER — Ambulatory Visit (INDEPENDENT_AMBULATORY_CARE_PROVIDER_SITE_OTHER): Payer: Medicaid Other | Admitting: *Deleted

## 2019-05-16 ENCOUNTER — Ambulatory Visit (INDEPENDENT_AMBULATORY_CARE_PROVIDER_SITE_OTHER): Payer: Self-pay | Admitting: Obstetrics & Gynecology

## 2019-05-16 VITALS — BP 129/86 | HR 73 | Wt 162.1 lb

## 2019-05-16 DIAGNOSIS — O0993 Supervision of high risk pregnancy, unspecified, third trimester: Secondary | ICD-10-CM

## 2019-05-16 DIAGNOSIS — O09819 Supervision of pregnancy resulting from assisted reproductive technology, unspecified trimester: Secondary | ICD-10-CM

## 2019-05-16 DIAGNOSIS — O24410 Gestational diabetes mellitus in pregnancy, diet controlled: Secondary | ICD-10-CM | POA: Diagnosis present

## 2019-05-16 DIAGNOSIS — O24419 Gestational diabetes mellitus in pregnancy, unspecified control: Secondary | ICD-10-CM

## 2019-05-16 DIAGNOSIS — Z3A36 36 weeks gestation of pregnancy: Secondary | ICD-10-CM

## 2019-05-16 NOTE — Progress Notes (Signed)
PRENATAL VISIT NOTE  Subjective:  Adriana Hahn is a 29 y.o. G1P0000 at [redacted]w[redacted]d being seen today for ongoing prenatal care.  She is currently monitored for the following issues for this high-risk pregnancy and has Infertility, female; Encounter for supervision of high risk pregnancy in second trimester, antepartum; PCOS (polycystic ovarian syndrome); ASCUS of cervix with negative high risk HPV; Language barrier affecting health care; Bacteriuria during pregnancy; and Gestational diabetes on their problem list.  Patient reports no complaints.  Contractions: Irregular. Vag. Bleeding: None.  Movement: Present. Denies leaking of fluid.   The following portions of the patient's history were reviewed and updated as appropriate: allergies, current medications, past family history, past medical history, past social history, past surgical history and problem list.   Objective:   Vitals:   05/16/19 0949  BP: 129/86  Pulse: 73  Weight: 162 lb 1.6 oz (73.5 kg)    Fetal Status: Fetal Heart Rate (bpm): RNST Fundal Height: 36 cm Movement: Present  Presentation: Vertex  General:  Alert, oriented and cooperative. Patient is in no acute distress.  Skin: Skin is warm and dry. No rash noted.   Cardiovascular: Normal heart rate noted  Respiratory: Normal respiratory effort, no problems with respiration noted  Abdomen: Soft, gravid, appropriate for gestational age.  Pain/Pressure: Present     Pelvic: Cervical exam performed Dilation: Closed Effacement (%): Thick Station: Ballotable  Extremities: Normal range of motion.  Edema: Trace  Mental Status: Normal mood and affect. Normal behavior. Normal judgment and thought content.   Imaging: [place] MFM FETAL BPP WO NON STRESS  Result Date: 05/15/2019 ----------------------------------------------------------------------  OBSTETRICS REPORT                       (Signed Final 05/15/2019 10:11 am) ----------------------------------------------------------------------  Patient Info  ID #:       [id number]                          D.O.B.:  06-10-1990 (28 yrs)  Name:       Adriana Hahn                    Visit Date: 05/15/2019 09:40 am ---------------------------------------------------------------------- Performed By  Performed By:     Emeline Darling BS,      Ref. Address:     [address]                    [place]                                                             [address]  Attending:        Ma Rings MD         [facility]  Referred By:      [facility] ----------------------------------------------------------------------  Orders   #  Description                          Code         Ordered By   1  [place] MFM OB FOLLOW UP                  [id number]     Sander Nephew   2  [place] MFM FETAL BPP WO NON              76819.01     [facility]      STRESS  ----------------------------------------------------------------------   #  Order #                    Accession #                 Episode #   1  [id number]                  [id number]                  [id number]   2  [id number]                  [id number]                  [id number]  ---------------------------------------------------------------------- Indications   Gestational diabetes in pregnancy,             O24.415   controlled by diet   Pregnancy resulting from assisted              O46.819   reproductive technology   Encounter for other antenatal screening        Z36.2   follow-up   [redacted] weeks gestation of pregnancy                Z3A.36  ---------------------------------------------------------------------- Vital Signs                                                 Height:        5'1" ---------------------------------------------------------------------- Fetal Evaluation  Num Of Fetuses:         1  Fetal Heart Rate(bpm):  125  Cardiac Activity:       Observed  Presentation:            Cephalic  Placenta:               Anterior  P. Cord Insertion:      Previously Visualized  Amniotic Fluid  AFI FV:      Within normal limits  AFI Sum(cm)     %Tile       Largest Pocket(cm)  20.18           77          5.6  RUQ(cm)       RLQ(cm)       LUQ(cm)        LLQ(cm)  4.1           5.44          5.6            5.04 ---------------------------------------------------------------------- Biophysical Evaluation  Amniotic F.V:   Pocket => 2 cm             F. Tone:        Observed  F. Movement:    Observed                   Score:          8/8  F. Breathing:   Observed ---------------------------------------------------------------------- Biometry  BPD:      88.7  mm     G. Age:  35w 6d         45  %    CI:        79.51   %    70 - 86                                                          FL/HC:      22.4   %    20.1 - 22.1  HC:      314.4  mm     G. Age:  35w 2d          6  %    HC/AC:      0.86        0.93 - 1.11  AC:      364.3  mm     G. Age:  40w 2d       > 99  %    FL/BPD:     79.3   %    71 - 87  FL:       70.3  mm     G. Age:  36w 0d         37  %    FL/AC:      19.3   %    20 - 24  Est. FW:    3445  gm    7 lb 10 oz      93  % ---------------------------------------------------------------------- OB History  Gravidity:    1         Term:   0        Prem:   0        SAB:   0  TOP:          0       Ectopic:  0        Living: 0 ---------------------------------------------------------------------- Gestational Age  LMP:           36w 3d        Date:  09/02/18                 EDD:   06/09/19  U/S Today:     36w 6d                                        EDD:   06/06/19  Best:          36w 3d     Det. By:  LMP  (09/02/18)          EDD:   06/09/19 ---------------------------------------------------------------------- Anatomy  Cranium:               Appears normal         Aortic Arch:            Previously seen  Cavum:                 Previously seen        Ductal Arch:            Previously seen  Ventricles:             Appears normal         Diaphragm:              Appears normal  Choroid Plexus:        Previously seen        Stomach:                Appears normal, left                                                                        sided  Cerebellum:            Previously seen        Abdomen:                Appears normal  Posterior Fossa:       Previously seen        Abdominal Wall:         Previously seen  Nuchal Fold:           Not applicable (>20    Cord Vessels:           Previously seen                         wks GA)  Face:                  Orbits and profile     Kidneys:                Appear normal                         previously seen  Lips:                  Previously seen        Bladder:                Appears normal  Thoracic:              Appears normal         Spine:                  Previously seen  Heart:                 Previously seen        Upper Extremities:      Previously seen  RVOT:                  Previously seen  Lower Extremities:      Previously seen  LVOT:                  Previously seen  Other:  Fetus appears to be female. Heels, Nasal bone, and Hands          previously visualized. ---------------------------------------------------------------------- Cervix Uterus Adnexa  Cervix  Not visualized (advanced GA >24wks) ---------------------------------------------------------------------- Comments  This patient was seen for a follow up growth scan due to A1  gestational diabetes.  She was informed that the fetal growth measures in the upper  normal limits and the amniotic fluid level appears appropriate  for her gestational age.  A biophysical profile performed today was 8 out of 8.  Another biophysical profile was scheduled in 1 week. ----------------------------------------------------------------------                   Ma Rings, MD Electronically Signed Final Report   05/15/2019 10:11 am ----------------------------------------------------------------------  [place] MFM OB  FOLLOW UP  Result Date: 05/15/2019 ----------------------------------------------------------------------  OBSTETRICS REPORT                       (Signed Final 05/15/2019 10:11 am) ---------------------------------------------------------------------- Patient Info  ID #:       [id number]                          D.O.B.:  1990/06/04 (28 yrs)  Name:       Adriana Hahn                    Visit Date: 05/15/2019 09:40 am ---------------------------------------------------------------------- Performed By  Performed By:     Emeline Darling BS,      Ref. Address:     [address]                    [place]                                                             [address]  Attending:        Ma Rings MD         [facility]  Referred By:      [facility] ---------------------------------------------------------------------- Orders   #  Description                          Code         Ordered By   1  [place] MFM OB FOLLOW UP                  [phone]     CORENTHIAN  BOOKER   2  [place] MFM FETAL BPP WO NON              [id number]     [facility]      STRESS  ----------------------------------------------------------------------   #  Order #                    Accession #                 Episode #   1  [id number]                  [id number]                  [id number]   2  [id number]                  [id number]                  [id number]  ---------------------------------------------------------------------- Indications   Gestational diabetes in pregnancy,             O24.415   controlled by diet   Pregnancy resulting from assisted              O09.819   reproductive technology   Encounter for other antenatal screening        Z36.2   follow-up   [redacted] weeks gestation of pregnancy                Z3A.36  ---------------------------------------------------------------------- Vital Signs                                                  Height:        5'1" ---------------------------------------------------------------------- Fetal Evaluation  Num Of Fetuses:         1  Fetal Heart Rate(bpm):  125  Cardiac Activity:       Observed  Presentation:           Cephalic  Placenta:               Anterior  P. Cord Insertion:      Previously Visualized  Amniotic Fluid  AFI FV:      Within normal limits  AFI Sum(cm)     %Tile       Largest Pocket(cm)  20.18           77          5.6  RUQ(cm)       RLQ(cm)       LUQ(cm)        LLQ(cm)  4.1           5.44          5.6            5.04 ---------------------------------------------------------------------- Biophysical Evaluation  Amniotic F.V:   Pocket => 2 cm             F. Tone:        Observed  F. Movement:    Observed                   Score:          8/8  F. Breathing:   Observed ---------------------------------------------------------------------- Biometry  BPD:      88.7  mm     G. Age:  35w 6d         45  %    CI:        79.51   %    70 - 86                                                          FL/HC:      22.4   %    20.1 - 22.1  HC:      314.4  mm     G. Age:  35w 2d          6  %    HC/AC:      0.86        0.93 - 1.11  AC:      364.3  mm     G. Age:  40w 2d       > 99  %    FL/BPD:     79.3   %    71 - 87  FL:       70.3  mm     G. Age:  36w 0d         37  %    FL/AC:      19.3   %    20 - 24  Est. FW:    3445  gm    7 lb 10 oz      93  % ---------------------------------------------------------------------- OB History  Gravidity:    1         Term:   0        Prem:   0        SAB:   0  TOP:          0       Ectopic:  0        Living: 0 ---------------------------------------------------------------------- Gestational Age  LMP:           36w 3d        Date:  09/02/18                 EDD:   06/09/19  U/S Today:     36w 6d                                        EDD:   06/06/19  Best:          36w 3d     Det. By:  LMP  (09/02/18)          EDD:   06/09/19  ---------------------------------------------------------------------- Anatomy  Cranium:               Appears normal         Aortic Arch:            Previously seen  Cavum:                 Previously seen        Ductal Arch:            Previously seen  Ventricles:            Appears normal         Diaphragm:  Appears normal  Choroid Plexus:        Previously seen        Stomach:                Appears normal, left                                                                        sided  Cerebellum:            Previously seen        Abdomen:                Appears normal  Posterior Fossa:       Previously seen        Abdominal Wall:         Previously seen  Nuchal Fold:           Not applicable (>20    Cord Vessels:           Previously seen                         wks GA)  Face:                  Orbits and profile     Kidneys:                Appear normal                         previously seen  Lips:                  Previously seen        Bladder:                Appears normal  Thoracic:              Appears normal         Spine:                  Previously seen  Heart:                 Previously seen        Upper Extremities:      Previously seen  RVOT:                  Previously seen        Lower Extremities:      Previously seen  LVOT:                  Previously seen  Other:  Fetus appears to be female. Heels, Nasal bone, and Hands          previously visualized. ---------------------------------------------------------------------- Cervix Uterus Adnexa  Cervix  Not visualized (advanced GA >24wks) ---------------------------------------------------------------------- Comments  This patient was seen for a follow up growth scan due to A1  gestational diabetes.  She was informed that the fetal growth measures in the upper  normal limits and the amniotic fluid level appears appropriate  for her gestational age.  A biophysical profile performed today was 8 out of 8.  Another biophysical profile  was scheduled in 1 week. ----------------------------------------------------------------------  Ma Rings, MD Electronically Signed Final Report   05/15/2019 10:11 am ----------------------------------------------------------------------  [place] MFM OB FOLLOW UP  Result Date: 04/17/2019 ----------------------------------------------------------------------  OBSTETRICS REPORT                       (Signed Final 04/17/2019 10:02 am) ---------------------------------------------------------------------- Patient Info  ID #:       [id number]                          D.O.B.:  [birth year] (28 yrs)  Name:       Adriana Hahn                    Visit Date: 04/17/2019 09:39 am ---------------------------------------------------------------------- Performed By  Performed By:     Emeline Darling BS,      Ref. Address:     [address]                    [place]                                                             [address]  Attending:        Noralee Space MD        [facility]  Referred By:      [facility] ---------------------------------------------------------------------- Orders   #  Description                          Code         Ordered By   1  [place] MFM OB FOLLOW UP                  [id number]     Lin Landsman  ----------------------------------------------------------------------   #  Order #                    Accession #                 Episode #   1  [id number]                  [id number]                  [id number]  ---------------------------------------------------------------------- Indications   Gestational diabetes in pregnancy,             O24.415   controlled by diet   Pregnancy resulting from assisted  O60.819   reproductive technology   Encounter for other antenatal screening        Z36.2   follow-up   [redacted] weeks gestation of  pregnancy                Z3A.32  ---------------------------------------------------------------------- Vital Signs                                                 Height:        5'1" ---------------------------------------------------------------------- Fetal Evaluation  Num Of Fetuses:         1  Fetal Heart Rate(bpm):  162  Cardiac Activity:       Observed  Presentation:           Cephalic  Placenta:               Anterior  P. Cord Insertion:      Previously Visualized  Amniotic Fluid  AFI FV:      Within normal limits  AFI Sum(cm)     %Tile       Largest Pocket(cm)  13.62           44          5.45  RUQ(cm)       RLQ(cm)       LUQ(cm)        LLQ(cm)  5.45          3.63          3.51           1.03 ---------------------------------------------------------------------- Biometry  BPD:      81.5  mm     G. Age:  32w 5d         52  %    CI:         75.6   %    70 - 86                                                          FL/HC:      21.9   %    19.1 - 21.3  HC:      297.2  mm     G. Age:  32w 6d         24  %    HC/AC:      0.96        0.96 - 1.17  AC:      310.6  mm     G. Age:  35w 0d         98  %    FL/BPD:     79.8   %    71 - 87  FL:         65  mm     G. Age:  33w 4d         68  %    FL/AC:      20.9   %    20 - 24  Est. FW:    2360  gm      5 lb 3 oz     89  % ---------------------------------------------------------------------- OB History  Gravidity:  1         Term:   0        Prem:   0        SAB:   0  TOP:          0       Ectopic:  0        Living: 0 ---------------------------------------------------------------------- Gestational Age  LMP:           32w 3d        Date:  09/02/18                 EDD:   06/09/19  U/S Today:     33w 4d                                        EDD:   06/01/19  Best:          32w 3d     Det. By:  LMP  (09/02/18)          EDD:   06/09/19 ---------------------------------------------------------------------- Anatomy  Cranium:               Appears normal         Aortic  Arch:            Previously seen  Cavum:                 Previously seen        Ductal Arch:            Previously seen  Ventricles:            Appears normal         Diaphragm:              Appears normal  Choroid Plexus:        Previously seen        Stomach:                Appears normal, left                                                                        sided  Cerebellum:            Previously seen        Abdomen:                Appears normal  Posterior Fossa:       Previously seen        Abdominal Wall:         Previously seen  Nuchal Fold:           Not applicable (>20    Cord Vessels:           Previously seen                         wks GA)  Face:                  Orbits and profile     Kidneys:  Appear normal                         previously seen  Lips:                  Previously seen        Bladder:                Appears normal  Thoracic:              Appears normal         Spine:                  Previously seen  Heart:                 Previously seen        Upper Extremities:      Previously seen  RVOT:                  Appears normal         Lower Extremities:      Previously seen  LVOT:                  Appears normal  Other:  Fetus appears to be female. Heels, Nasal bone, and Hands          previously visualized. ---------------------------------------------------------------------- Cervix Uterus Adnexa  Cervix  Not visualized (advanced GA >24wks) ---------------------------------------------------------------------- Impression  Patient has gestational diabetes that was diagnosed early in  pregnancy.  Hemoglobin A1c was 4.2% (normal).  Patient  informed that she took Metformin before pregnancy (possibly  PCOS).  She was told that she did not have type 2 diabetes.  Amniotic fluid is normal and good fetal activity is seen.  Fetal  growth is appropriate for gestational age (EFW at the 89th  percentile).  Incidentally observed antenatal testing is  reassuring. BPP 8/8.  Since  it is difficult to differentiate between type 2 diabetes  and early gestational diabetes, we recommend weekly  antenatal testing from [redacted] weeks gestation.  BP at our office: 117/84 mm Hg.  I counseled the patient of the findings with the help of  Spanish language interpreter. ---------------------------------------------------------------------- Recommendations  -An appointment was made for her to return in 4 weeks for  fetal growth assessment.  -Weekly BPP from next visit.  -Postpartum screening for type 2 diabetes. ----------------------------------------------------------------------                  Noralee Spaceavi Shankar, MD Electronically Signed Final Report   04/17/2019 10:02 am ----------------------------------------------------------------------   Assessment and Plan:  Pregnancy: G1P0000 at 307w4d 1. Gestational diabetes mellitus (GDM), antepartum, gestational diabetes method of control unspecified Stable CBGs, continue diet control.  Patient's GDM was diagnosed early in pregnancy, likely Class B. NST performed today was reviewed and was found to be reactive. Had recent BPP that was 8/8.   Continue recommended antenatal testing and prenatal care.  2. Supervision of high risk pregnancy in third trimester Pelvic cultures done. - Strep Gp B NAA - GC/Chlamydia probe amp ([place] Preterm labor symptoms and general obstetric precautions including but not limited to vaginal bleeding, contractions, leaking of fluid and fetal movement were reviewed in detail with the patient. Please refer to After Visit Summary for other counseling recommendations.   Return in about 1 week (around 05/23/2019) for as scheduled.  Future Appointments  Date Time Provider Department Center  05/23/2019  8:15 AM WOC-WOCA  NST [facility] WOC  05/23/2019  9:15 AM Cristen Murcia, Jethro Bastos, MD WOC-WOCA WOC  05/23/2019 10:45 AM WH-MFC NURSE WH-MFC MFC-US  05/23/2019 10:45 AM WH-MFC [place] 5 WH-MFCUS MFC-US  05/30/2019  8:00 AM WH-MFC [place]  3 WH-MFCUS MFC-US  05/30/2019  9:15 AM WOC-WOCA NST WOC-WOCA WOC  05/30/2019 10:00 AM WH-MFC NURSE WH-MFC MFC-US  05/30/2019 10:15 AM Talton Delpriore, Jethro Bastos, MD WOC-WOCA WOC    Jaynie Collins, MD

## 2019-05-16 NOTE — Progress Notes (Signed)
Interpreter Eda Royal present for encounter. Pt had [place] growth and BPP @ [facility] yesterday. Reactive NST today.

## 2019-05-17 LAB — GC/CHLAMYDIA PROBE AMP (~~LOC~~) NOT AT ARMC
Chlamydia: NEGATIVE
Comment: NEGATIVE
Comment: NORMAL
Neisseria Gonorrhea: NEGATIVE

## 2019-05-18 LAB — STREP GP B NAA: Strep Gp B NAA: NEGATIVE

## 2019-05-22 ENCOUNTER — Ambulatory Visit (HOSPITAL_COMMUNITY): Payer: Self-pay

## 2019-05-22 ENCOUNTER — Encounter (HOSPITAL_COMMUNITY): Payer: Self-pay

## 2019-05-23 ENCOUNTER — Ambulatory Visit (HOSPITAL_COMMUNITY): Payer: Self-pay

## 2019-05-23 ENCOUNTER — Ambulatory Visit (INDEPENDENT_AMBULATORY_CARE_PROVIDER_SITE_OTHER): Payer: Medicaid Other | Admitting: *Deleted

## 2019-05-23 ENCOUNTER — Other Ambulatory Visit: Payer: Self-pay

## 2019-05-23 ENCOUNTER — Encounter: Payer: Self-pay | Admitting: Obstetrics & Gynecology

## 2019-05-23 ENCOUNTER — Telehealth (HOSPITAL_COMMUNITY): Payer: Self-pay | Admitting: *Deleted

## 2019-05-23 ENCOUNTER — Encounter: Payer: Self-pay | Admitting: *Deleted

## 2019-05-23 ENCOUNTER — Encounter (HOSPITAL_COMMUNITY): Payer: Self-pay | Admitting: *Deleted

## 2019-05-23 ENCOUNTER — Ambulatory Visit: Payer: Self-pay

## 2019-05-23 ENCOUNTER — Ambulatory Visit (INDEPENDENT_AMBULATORY_CARE_PROVIDER_SITE_OTHER): Payer: Medicaid Other | Admitting: Obstetrics & Gynecology

## 2019-05-23 ENCOUNTER — Encounter (HOSPITAL_COMMUNITY): Payer: Self-pay

## 2019-05-23 VITALS — BP 125/86 | HR 78 | Wt 163.3 lb

## 2019-05-23 DIAGNOSIS — O24419 Gestational diabetes mellitus in pregnancy, unspecified control: Secondary | ICD-10-CM

## 2019-05-23 DIAGNOSIS — Z3A37 37 weeks gestation of pregnancy: Secondary | ICD-10-CM

## 2019-05-23 DIAGNOSIS — O0993 Supervision of high risk pregnancy, unspecified, third trimester: Secondary | ICD-10-CM

## 2019-05-23 NOTE — Patient Instructions (Addendum)
Induccin del [place] de parto en 27 de Febrero, va a la hospital de [place] a 11:45 pm en Viernes 26 de [employer].   Se denomina induccin del trabajo de parto cuando se inician acciones para hacer que una mujer embarazada comience el [place] de [place]. La [employer] de las mujeres comienzan el trabajo de parto de forma natural entre las semanas 33 y 71 del Media planner. Cuando esto no ocurre o cuando por necesidad mdica se debe iniciarlo por otros medios, se podran Event organiser. La induccin del trabajo de parto hace que el tero se contraiga. Tambin hace que el cuello uterino se ablande (madure), se abra (se dilate), y se afine (se borre). Generalmente, el trabajo de parto no se induce antes de las 39semanas, excepto que haya un motivo mdico para Nature conservation officer. El mdico determinar si se debe inducir el [place]. Antes de inducir el trabajo de [place], el mdico considerar ciertos factores; entre ellos:  Su cuadro clnico y el del beb.  En cul semana del embarazo se encuentra.  La madurez de los pulmones del beb.  El estado del cuello uterino.  La posicin del beb.  El tamao del canal del parto. Cules son algunos de los motivos para inducir un parto? Se podra inducir un parto en los siguientes casos:  Su salud o la salud del beb estn en riesgo.  El embarazo se pas de trmino 1semana o ms.  Rompi la bolsa, pero no se inici el trabajo de parto de forma natural.  La cantidad de lquido amnitico que rodea al beb es poca. Tambin podra optar por (elegir) que le induzcan el trabajo de parto en un determinado momento. Por lo general, la induccin del trabajo de parto por eleccin no se hace antes de las 39semanas del [place]. Qu mtodos se usan para inducir el [place] de [place]? Los mtodos utilizados para inducir el trabajo de parto incluyen los siguientes:  Administracin de prostaglandina. Este medicamento hace que se inicien las contracciones y que el cuello  uterino se dilate y [place]. Puede tomarse por boca (de forma oral) o introducirse en la vagina (supositorio).  Insercin de un pequeo tubo delgado (catter) que tiene un baln en el extremo en la vagina; luego, expansin del baln con agua para dilatar el cuello uterino.  Ruptura de las [place]. En este mtodo, el mdico separa, con cuidado, el tejido del saco amnitico del cuello uterino. En consecuencia, el cuello uterino se expande, lo que, a la vez, provoca la liberacin de una hormona llamada progesterona. Esta hormona hace que el tero se contraiga. Este procedimiento suele Associate Professor del mdico; luego, la enviarn a su hogar para esperar a que Nature conservation officer.  Romper la bolsa de las aguas. En este mtodo, el mdico [place] un pequeo instrumento para hacer un pequeo orificio en el saco amnitico. Al cabo de un tiempo, esto har que el saco amnitico se rompa. Las contracciones deberan comenzar algunas horas despus.  Medicamentos que desencadenen o [place] contracciones. Estos se administran a travs de una va intravenosa (IV) que se coloca en una vena del brazo. Excepto la ruptura de las [place], que puede realizarse en una [place], la induccin del [place] de parto se realiza en el hospital para que puedan controlarlos con atencin a usted y al beb. Cunto tiempo lleva inducir el [place] de parto? La duracin del proceso de induccin depende de la preparacin del cuerpo para el trabajo de [place]. Algunas inducciones pueden durar hasta 2 o  3das, mientras que otras podran durar menos de Civil engineer, contracting. La induccin podra durar ms en los siguientes casos:  La induccin se hace en una etapa temprana del embarazo.  Es Engineer, agricultural de la [place].  El cuello uterino no est listo. Cules son algunos de los riesgos asociados con la induccin del [place] de [place]? Algunos de los riesgos asociados con la induccin del [place] de parto son los  siguientes:  Cambios en la frecuencia cardaca fetal, por ejemplo, los latidos son demasiado rpidos o lentos, o son irregulares (errticos).  Fracaso de la induccin.  Infeccin en la madre o el beb.  Aumento de la posibilidad de que sea necesaria una cesrea.  Muerte fetal.  Ruptura (desprendimiento) de la placenta del tero (raro).  Ruptura del tero (muy poco frecuente). Cuando es Passenger transport manager una induccin por motivos mdicos, los beneficios suelen [employer]. Cules son algunos de los motivos para no inducir el [place] de [place]? La induccin no debe realizarse si:  El beb no tolera las contracciones.  Se someti anteriormente a cirugas en el tero, como una miomectoma, le extirparon fibromas o tiene una cicatriz vertical de un parto por cesrea anterior.  La placenta est en una posicin muy baja en el tero y obstruye la abertura del cuello uterino (placenta previa).  El beb no est ubicado con la [employer].  El cordn umbilical cae hacia el canal del parto, adelante del beb.  Hay circunstancias poco habituales, como que se encuentre en una etapa muy temprana del embarazo (beb prematuro).  Tuvo ms de 2partos por cesrea anteriormente. Resumen  Se denomina induccin del trabajo de parto cuando se inician acciones para hacer que una mujer embarazada comience el [place] de [place].  La induccin del trabajo de parto hace que el tero se contraiga. Tambin hace que el cuello uterino se [place], se dilate y se borre.  [place] de parto no se induce antes de las 39semanas de [place], excepto que haya un motivo mdico para Media planner.  Cuando es Passenger transport manager una induccin por motivos mdicos, los beneficios suelen [employer]. Esta informacin no tiene Theme park manager el consejo del mdico. Asegrese de hacerle al mdico cualquier pregunta que tenga. Document Revised: 11/30/2016 Document Reviewed: 11/30/2016 Elsevier Patient  Education  2020 [employer].

## 2019-05-23 NOTE — Progress Notes (Deleted)
PRENATAL VISIT NOTE  Subjective:  Adriana Hahn is a 29 y.o. G1P0000 at [redacted]w[redacted]d being seen today for ongoing prenatal care.  She is currently monitored for the following issues for this high-risk pregnancy and has Infertility, female; Encounter for supervision of high risk pregnancy in third trimester, antepartum; PCOS (polycystic ovarian syndrome); ASCUS of cervix with negative high risk HPV; Language barrier affecting health care; Bacteriuria during pregnancy; and Gestational diabetes on their problem list.  Patient reports no complaints.   .  .   . Denies leaking of fluid.   The following portions of the patient's history were reviewed and updated as appropriate: allergies, current medications, past family history, past medical history, past social history, past surgical history and problem list.   Objective:     Fetal Status:        Presentation: Vertex  General:  Alert, oriented and cooperative. Patient is in no acute distress.  Skin: Skin is warm and dry. No rash noted.   Cardiovascular: Normal heart rate noted  Respiratory: Normal respiratory effort, no problems with respiration noted  Abdomen: Soft, gravid, appropriate for gestational age.        Pelvic: Cervical exam deferred        Extremities: Normal range of motion.     Mental Status: Normal mood and affect. Normal behavior. Normal judgment and thought content.   Imaging: [place] MFM FETAL BPP WO NON STRESS  Result Date: 05/15/2019 ----------------------------------------------------------------------  OBSTETRICS REPORT                       (Signed Final 05/15/2019 10:11 am) ---------------------------------------------------------------------- Patient Info  ID #:       [id number]                          D.O.B.:  04-06-1990 (28 yrs)  Name:       Adriana Hahn                    Visit Date: 05/15/2019 09:40 am ---------------------------------------------------------------------- Performed By  Performed By:     Emeline Darling BS,       Ref. Address:     [address]                    RDMS                                                             [address]  Attending:        Ma Rings MD         [facility]  Referred By:      [facility] ---------------------------------------------------------------------- Orders   #  Description  Code         Ordered By   1  US MFM OB FOLLOW UP                  [id number]     Lin LandsmanCORENTHIAN                                                        BOOKER   2  US MFM FETAL BPP WO NON              76819.01     [facility]      STRESS  ----------------------------------------------------------------------   #  Order #                    Accession #                 Episode #   1  [id number]                  [id number]   2  [id number]                  [id number]                  [id number]  ---------------------------------------------------------------------- Indications   Gestational diabetes in pregnancy,             O24.415   controlled by diet   Pregnancy resulting from assisted              O09.819   reproductive technology   Encounter for other antenatal screening        Z36.2   follow-up   [redacted] weeks gestation of pregnancy                Z3A.36  ---------------------------------------------------------------------- Vital Signs                                                 Height:        5'1" ---------------------------------------------------------------------- Fetal Evaluation  Num Of Fetuses:         1  Fetal Heart Rate(bpm):  125  Cardiac Activity:       Observed  Presentation:           Cephalic  Placenta:               Anterior  P. Cord Insertion:      Previously Visualized  Amniotic Fluid  AFI FV:      Within normal limits  AFI Sum(cm)     %Tile       Largest Pocket(cm)  20.18           77          5.6  RUQ(cm)       RLQ(cm)       LUQ(cm)        LLQ(cm)  4.1            5.44          5.6  5.04 ---------------------------------------------------------------------- Biophysical Evaluation  Amniotic F.V:   Pocket => 2 cm             F. Tone:        Observed  F. Movement:    Observed                   Score:          8/8  F. Breathing:   Observed ---------------------------------------------------------------------- Biometry  BPD:      88.7  mm     G. Age:  35w 6d         45  %    CI:        79.51   %    70 - 86                                                          FL/HC:      22.4   %    20.1 - 22.1  HC:      314.4  mm     G. Age:  35w 2d          6  %    HC/AC:      0.86        0.93 - 1.11  AC:      364.3  mm     G. Age:  40w 2d       > 99  %    FL/BPD:     79.3   %    71 - 87  FL:       70.3  mm     G. Age:  36w 0d         37  %    FL/AC:      19.3   %    20 - 24  Est. FW:    3445  gm    7 lb 10 oz      93  % ---------------------------------------------------------------------- OB History  Gravidity:    1         Term:   0        Prem:   0        SAB:   0  TOP:          0       Ectopic:  0        Living: 0 ---------------------------------------------------------------------- Gestational Age  LMP:           36w 3d        Date:  09/02/18                 EDD:   06/09/19  U/S Today:     36w 6d                                        EDD:   06/06/19  Best:          36w 3d     Det. By:  LMP  (09/02/18)          EDD:   06/09/19 ---------------------------------------------------------------------- Anatomy  Cranium:  Appears normal         Aortic Arch:            Previously seen  Cavum:                 Previously seen        Ductal Arch:            Previously seen  Ventricles:            Appears normal         Diaphragm:              Appears normal  Choroid Plexus:        Previously seen        Stomach:                Appears normal, left                                                                        sided  Cerebellum:            Previously seen         Abdomen:                Appears normal  Posterior Fossa:       Previously seen        Abdominal Wall:         Previously seen  Nuchal Fold:           Not applicable (>20    Cord Vessels:           Previously seen                         wks GA)  Face:                  Orbits and profile     Kidneys:                Appear normal                         previously seen  Lips:                  Previously seen        Bladder:                Appears normal  Thoracic:              Appears normal         Spine:                  Previously seen  Heart:                 Previously seen        Upper Extremities:      Previously seen  RVOT:                  Previously seen        Lower Extremities:      Previously seen  LVOT:                  Previously  seen  Other:  Fetus appears to be female. Heels, Nasal bone, and Hands          previously visualized. ---------------------------------------------------------------------- Cervix Uterus Adnexa  Cervix  Not visualized (advanced GA >24wks) ---------------------------------------------------------------------- Comments  This patient was seen for a follow up growth scan due to A1  gestational diabetes.  She was informed that the fetal growth measures in the upper  normal limits and the amniotic fluid level appears appropriate  for her gestational age.  A biophysical profile performed today was 8 out of 8.  Another biophysical profile was scheduled in 1 week. ----------------------------------------------------------------------                   Ma Rings, MD Electronically Signed Final Report   05/15/2019 10:11 am ----------------------------------------------------------------------  [place] MFM OB FOLLOW UP  Result Date: 05/15/2019 ----------------------------------------------------------------------  OBSTETRICS REPORT                       (Signed Final 05/15/2019 10:11 am) ---------------------------------------------------------------------- Patient Info  ID #:        [id number]                          D.O.B.:  1990/09/07 (28 yrs)  Name:       Adriana Hahn                    Visit Date: 05/15/2019 09:40 am ---------------------------------------------------------------------- Performed By  Performed By:     Emeline Darling BS,      Ref. Address:     [address]                    RDMS                                                             [address]  Attending:        Ma Rings MD         [facility]  Referred By:      [facility] ---------------------------------------------------------------------- Orders   #  Description                          Code         Ordered By   1  [place] MFM OB FOLLOW UP                  40981.19     Lin Landsman  2  [place] MFM FETAL BPP WO NON              76819.01     RAVI [facility]      STRESS  ----------------------------------------------------------------------   #  Order #                    Accession #                 Episode #   1  [id number]                  [id number]   2  [id number]                  [id number]                  [id number]  ---------------------------------------------------------------------- Indications   Gestational diabetes in pregnancy,             O24.415   controlled by diet   Pregnancy resulting from assisted              O09.819   reproductive technology   Encounter for other antenatal screening        Z36.2   follow-up   [redacted] weeks gestation of pregnancy                Z3A.36  ---------------------------------------------------------------------- Vital Signs                                                 Height:        5'1" ---------------------------------------------------------------------- Fetal Evaluation  Num Of Fetuses:         1  Fetal Heart Rate(bpm):  125  Cardiac Activity:       Observed  Presentation:           Cephalic  Placenta:                Anterior  P. Cord Insertion:      Previously Visualized  Amniotic Fluid  AFI FV:      Within normal limits  AFI Sum(cm)     %Tile       Largest Pocket(cm)  20.18           77          5.6  RUQ(cm)       RLQ(cm)       LUQ(cm)        LLQ(cm)  4.1           5.44          5.6            5.04 ---------------------------------------------------------------------- Biophysical Evaluation  Amniotic F.V:   Pocket => 2 cm             F. Tone:        Observed  F. Movement:    Observed                   Score:          8/8  F. Breathing:   Observed ---------------------------------------------------------------------- Biometry  BPD:      88.7  mm     G. Age:  35w 6d  45  %    CI:        79.51   %    70 - 86                                                          FL/HC:      22.4   %    20.1 - 22.1  HC:      314.4  mm     G. Age:  35w 2d          6  %    HC/AC:      0.86        0.93 - 1.11  AC:      364.3  mm     G. Age:  40w 2d       > 99  %    FL/BPD:     79.3   %    71 - 87  FL:       70.3  mm     G. Age:  36w 0d         37  %    FL/AC:      19.3   %    20 - 24  Est. FW:    3445  gm    7 lb 10 oz      93  % ---------------------------------------------------------------------- OB History  Gravidity:    1         Term:   0        Prem:   0        SAB:   0  TOP:          0       Ectopic:  0        Living: 0 ---------------------------------------------------------------------- Gestational Age  LMP:           36w 3d        Date:  09/02/18                 EDD:   06/09/19  U/S Today:     36w 6d                                        EDD:   06/06/19  Best:          36w 3d     Det. By:  LMP  (09/02/18)          EDD:   06/09/19 ---------------------------------------------------------------------- Anatomy  Cranium:               Appears normal         Aortic Arch:            Previously seen  Cavum:                 Previously seen        Ductal Arch:            Previously seen  Ventricles:            Appears normal          Diaphragm:  Appears normal  Choroid Plexus:        Previously seen        Stomach:                Appears normal, left                                                                        sided  Cerebellum:            Previously seen        Abdomen:                Appears normal  Posterior Fossa:       Previously seen        Abdominal Wall:         Previously seen  Nuchal Fold:           Not applicable (>20    Cord Vessels:           Previously seen                         wks GA)  Face:                  Orbits and profile     Kidneys:                Appear normal                         previously seen  Lips:                  Previously seen        Bladder:                Appears normal  Thoracic:              Appears normal         Spine:                  Previously seen  Heart:                 Previously seen        Upper Extremities:      Previously seen  RVOT:                  Previously seen        Lower Extremities:      Previously seen  LVOT:                  Previously seen  Other:  Fetus appears to be female. Heels, Nasal bone, and Hands          previously visualized. ---------------------------------------------------------------------- Cervix Uterus Adnexa  Cervix  Not visualized (advanced GA >24wks) ---------------------------------------------------------------------- Comments  This patient was seen for a follow up growth scan due to A1  gestational diabetes.  She was informed that the fetal growth measures in the upper  normal limits and the amniotic fluid level appears appropriate  for her gestational age.  A biophysical profile performed today was 8 out of 8.  Another biophysical profile was scheduled in 1 week. ----------------------------------------------------------------------  Ma Rings, MD Electronically Signed Final Report   05/15/2019 10:11 am ----------------------------------------------------------------------   Assessment and Plan:  Pregnancy:  G1P0000 at [redacted]w[redacted]d 1. Gestational diabetes mellitus (GDM), antepartum, gestational diabetes method of control unspecified CBGs within range, controlled on diet. Likely Class B DM given early diagnosis.  NST performed today was reviewed and was found to be reactive. Subsequent BPP performed today was also reviewed and was found to be 10/10. AFI was also normal. IOL scheduled at 39 weeks, orders placed. - US FETAL BPP W/NONSTRESS; Future  2. Supervision of high risk pregnancy in third trimester Term labor symptoms and general obstetric precautions including but not limited to vaginal bleeding, contractions, leaking of fluid and fetal movement were reviewed in detail with the patient. Please refer to After Visit Summary for other counseling recommendations.   Return in about 1 week (around 05/30/2019) for As scheduled.  Future Appointments  Date Time Provider Department Center  05/23/2019  9:35 AM [facility] IMAGING [facility] WOC  05/30/2019  9:15 AM [facility] NST [facility] WOC  05/30/2019 10:15 AM Jacora Hopkins, Jethro Bastos, MD [facility] WOC  06/02/2019 12:00 AM [facility] SCHED ROOM [facility] None    Jaynie Collins, MD

## 2019-05-23 NOTE — Patient Instructions (Signed)
Induccin del [place] de parto en 27 de Febrero, va a la hospital de [place] a 11:45 pm en Viernes 26 de [employer].  Labor Induction  Se denomina induccin del trabajo de parto cuando se inician acciones para hacer que una mujer embarazada comience el [place] de [place]. La [employer] de las mujeres comienzan el trabajo de parto de forma natural entre las semanas 51 y 22 del Media planner. Cuando esto no ocurre o cuando por necesidad mdica se debe iniciarlo por otros medios, se podran Event organiser. La induccin del trabajo de parto hace que el tero se contraiga. Tambin hace que el cuello uterino se ablande (madure), se abra (se dilate), y se afine (se borre). Generalmente, el trabajo de parto no se induce antes de las 39semanas, excepto que haya un motivo mdico para Nature conservation officer. El mdico determinar si se debe inducir el [place]. Antes de inducir el trabajo de [place], el mdico considerar ciertos factores; entre ellos:  Su cuadro clnico y el del beb.  En cul semana del embarazo se encuentra.  La madurez de los pulmones del beb.  El estado del cuello uterino.  La posicin del beb.  El tamao del canal del parto. Cules son algunos de los motivos para inducir un parto? Se podra inducir un parto en los siguientes casos:  Su salud o la salud del beb estn en riesgo.  El embarazo se pas de trmino 1semana o ms.  Rompi la bolsa, pero no se inici el trabajo de parto de forma natural.  La cantidad de lquido amnitico que rodea al beb es poca. Tambin podra optar por (elegir) que le induzcan el trabajo de parto en un determinado momento. Por lo general, la induccin del trabajo de parto por eleccin no se hace antes de las 39semanas del [place]. Qu mtodos se usan para inducir el [place] de [place]? Los mtodos utilizados para inducir el trabajo de parto incluyen los siguientes:  Administracin de prostaglandina. Este medicamento hace que se inicien las contracciones y  que el cuello uterino se dilate y [place]. Puede tomarse por boca (de forma oral) o introducirse en la vagina (supositorio).  Insercin de un pequeo tubo delgado (catter) que tiene un baln en el extremo en la vagina; luego, expansin del baln con agua para dilatar el cuello uterino.  Ruptura de las [place]. En este mtodo, el mdico separa, con cuidado, el tejido del saco amnitico del cuello uterino. En consecuencia, el cuello uterino se expande, lo que, a la vez, provoca la liberacin de una hormona llamada progesterona. Esta hormona hace que el tero se contraiga. Este procedimiento suele Associate Professor del mdico; luego, la enviarn a su hogar para esperar a que Nature conservation officer.  Romper la bolsa de las aguas. En este mtodo, el mdico [place] un pequeo instrumento para hacer un pequeo orificio en el saco amnitico. Al cabo de un tiempo, esto har que el saco amnitico se rompa. Las contracciones deberan comenzar algunas horas despus.  Medicamentos que desencadenen o [place] contracciones. Estos se administran a travs de una va intravenosa (IV) que se coloca en una vena del brazo. Excepto la ruptura de las [place], que puede realizarse en una [place], la induccin del [place] de parto se realiza en el hospital para que puedan controlarlos con atencin a usted y al beb. Cunto tiempo lleva inducir el [place] de parto? La duracin del proceso de induccin depende de la preparacin del cuerpo para el trabajo de [place]. Algunas inducciones pueden durar [employer]  2 o 3das, mientras que otras podran durar menos de Civil engineer, contracting. La induccin podra durar ms en los siguientes casos:  La induccin se hace en una etapa temprana del embarazo.  Es Engineer, agricultural de la [place].  El cuello uterino no est listo. Cules son algunos de los riesgos asociados con la induccin del [place] de [place]? Algunos de los riesgos asociados con la induccin del [place] de parto  son los siguientes:  Cambios en la frecuencia cardaca fetal, por ejemplo, los latidos son demasiado rpidos o lentos, o son irregulares (errticos).  Fracaso de la induccin.  Infeccin en la madre o el beb.  Aumento de la posibilidad de que sea necesaria una cesrea.  Muerte fetal.  Ruptura (desprendimiento) de la placenta del tero (raro).  Ruptura del tero (muy poco frecuente). Cuando es Passenger transport manager una induccin por motivos mdicos, los beneficios suelen [employer]. Cules son algunos de los motivos para no inducir el [place] de [place]? La induccin no debe realizarse si:  El beb no tolera las contracciones.  Se someti anteriormente a cirugas en el tero, como una miomectoma, le extirparon fibromas o tiene una cicatriz vertical de un parto por cesrea anterior.  La placenta est en una posicin muy baja en el tero y obstruye la abertura del cuello uterino (placenta previa).  El beb no est ubicado con la [employer].  El cordn umbilical cae hacia el canal del parto, adelante del beb.  Hay circunstancias poco habituales, como que se encuentre en una etapa muy temprana del embarazo (beb prematuro).  Tuvo ms de 2partos por cesrea anteriormente. Resumen  Se denomina induccin del trabajo de parto cuando se inician acciones para hacer que una mujer embarazada comience el [place] de [place].  La induccin del trabajo de parto hace que el tero se contraiga. Tambin hace que el cuello uterino se [place], se dilate y se borre.  El [place] de parto no se induce antes de las 39semanas de [place], excepto que haya un motivo mdico para Media planner.  Cuando es Passenger transport manager una induccin por motivos mdicos, los beneficios suelen [employer]. Esta informacin no tiene Theme park manager el consejo del mdico. Asegrese de hacerle al mdico cualquier pregunta que tenga. Document Revised: 11/30/2016 Document Reviewed: 11/30/2016 Elsevier  Patient Education  2020 [employer].

## 2019-05-23 NOTE — Progress Notes (Signed)
   PRENATAL VISIT NOTE  Subjective:  Adriana Hahn is a 29 y.o. G1P0000 at [redacted]w[redacted]d being seen today for ongoing prenatal care. Patient is Spanish-speaking only, Spanish interpreter present for this encounter.  She is currently monitored for the following issues for this high-risk pregnancy and has Infertility, female; Encounter for supervision of high risk pregnancy in third trimester, antepartum; PCOS (polycystic ovarian syndrome); ASCUS of cervix with negative high risk HPV; Language barrier affecting health care; Bacteriuria during pregnancy; and Gestational diabetes on their problem list.  Patient reports no complaints.  Contractions: Irregular. Vag. Bleeding: None.  Movement: Present. Denies leaking of fluid.   The following portions of the patient's history were reviewed and updated as appropriate: allergies, current medications, past family history, past medical history, past social history, past surgical history and problem list.   Objective:   Vitals:   05/23/19 0835  BP: 125/86  Pulse: 78  Weight: 163 lb 4.8 oz (74.1 kg)    Fetal Status: Fetal Heart Rate (bpm): RNST   Movement: Present     General:  Alert, oriented and cooperative. Patient is in no acute distress.  Skin: Skin is warm and dry. No rash noted.   Cardiovascular: Normal heart rate noted  Respiratory: Normal respiratory effort, no problems with respiration noted  Abdomen: Soft, gravid, appropriate for gestational age.  Pain/Pressure: Present     Pelvic: Cervical exam deferred        Extremities: Normal range of motion.  Edema: Trace  Mental Status: Normal mood and affect. Normal behavior. Normal judgment and thought content.   Assessment and Plan:  Pregnancy: G1P0000 at [redacted]w[redacted]d 1. Gestational diabetes mellitus (GDM), antepartum, gestational diabetes method of control unspecified CBGs within range, controlled on diet. Likely Class B DM given early diagnosis.  NST performed today was reviewed and was found to be  reactive. Subsequent BPP performed today was also reviewed and was found to be 10/10. AFI was also normal. IOL scheduled at 39 weeks, orders placed.  2. Supervision of high risk pregnancy in third trimester Pelvic cultures negative last week, patient informed. Term labor symptoms and general obstetric precautions including but not limited to vaginal bleeding, contractions, leaking of fluid and fetal movement were reviewed in detail with the patient. Please refer to After Visit Summary for other counseling recommendations.   Return in about 1 week (around 05/30/2019) for as scheduled.  Future Appointments  Date Time Provider Department Center  05/30/2019  9:15 AM [facility] NST [facility]  05/30/2019 10:15 AM Ashana Tullo, Jethro Bastos, MD [facility]  06/02/2019 12:00 AM [facility] SCHED ROOM [facility] None    Jaynie Collins, MD

## 2019-05-23 NOTE — Telephone Encounter (Signed)
Preadmission screen Interpreter number [phone]

## 2019-05-28 ENCOUNTER — Encounter (HOSPITAL_COMMUNITY): Payer: Self-pay | Admitting: Obstetrics and Gynecology

## 2019-05-28 ENCOUNTER — Other Ambulatory Visit: Payer: Self-pay

## 2019-05-28 ENCOUNTER — Inpatient Hospital Stay (HOSPITAL_COMMUNITY)
Admission: AD | Admit: 2019-05-28 | Discharge: 2019-05-31 | DRG: 807 | Disposition: A | Discharge status: Home / Self Care | Payer: Medicaid Other | Attending: Family Medicine | Admitting: Family Medicine

## 2019-05-28 ENCOUNTER — Inpatient Hospital Stay (HOSPITAL_COMMUNITY): Payer: Medicaid Other | Admitting: Anesthesiology

## 2019-05-28 DIAGNOSIS — O24420 Gestational diabetes mellitus in childbirth, diet controlled: Secondary | ICD-10-CM | POA: Diagnosis present

## 2019-05-28 DIAGNOSIS — N979 Female infertility, unspecified: Secondary | ICD-10-CM | POA: Diagnosis present

## 2019-05-28 DIAGNOSIS — Z758 Other problems related to medical facilities and other health care: Secondary | ICD-10-CM | POA: Diagnosis present

## 2019-05-28 DIAGNOSIS — Z3A38 38 weeks gestation of pregnancy: Secondary | ICD-10-CM

## 2019-05-28 DIAGNOSIS — O9902 Anemia complicating childbirth: Secondary | ICD-10-CM | POA: Diagnosis present

## 2019-05-28 DIAGNOSIS — D649 Anemia, unspecified: Secondary | ICD-10-CM | POA: Diagnosis present

## 2019-05-28 DIAGNOSIS — O1490 Unspecified pre-eclampsia, unspecified trimester: Secondary | ICD-10-CM

## 2019-05-28 DIAGNOSIS — O1404 Mild to moderate pre-eclampsia, complicating childbirth: Principal | ICD-10-CM | POA: Diagnosis present

## 2019-05-28 DIAGNOSIS — Z20822 Contact with and (suspected) exposure to covid-19: Secondary | ICD-10-CM | POA: Diagnosis present

## 2019-05-28 DIAGNOSIS — Z8632 Personal history of gestational diabetes: Secondary | ICD-10-CM | POA: Diagnosis present

## 2019-05-28 DIAGNOSIS — O0993 Supervision of high risk pregnancy, unspecified, third trimester: Secondary | ICD-10-CM

## 2019-05-28 DIAGNOSIS — Z789 Other specified health status: Secondary | ICD-10-CM | POA: Diagnosis present

## 2019-05-28 DIAGNOSIS — O24419 Gestational diabetes mellitus in pregnancy, unspecified control: Secondary | ICD-10-CM | POA: Diagnosis present

## 2019-05-28 LAB — URINALYSIS, ROUTINE W REFLEX MICROSCOPIC
Bilirubin Urine: NEGATIVE
Glucose, UA: NEGATIVE mg/dL
Hgb urine dipstick: NEGATIVE
Ketones, ur: NEGATIVE mg/dL
Leukocytes,Ua: NEGATIVE
Nitrite: NEGATIVE
Protein, ur: 30 mg/dL — AB
Specific Gravity, Urine: 1.008 (ref 1.005–1.030)
pH: 6 (ref 5.0–8.0)

## 2019-05-28 LAB — GLUCOSE, CAPILLARY
Glucose-Capillary: 62 mg/dL — ABNORMAL LOW (ref 70–99)
Glucose-Capillary: 90 mg/dL (ref 70–99)

## 2019-05-28 LAB — TYPE AND SCREEN
ABO/RH(D): O POS
Antibody Screen: NEGATIVE

## 2019-05-28 LAB — COMPREHENSIVE METABOLIC PANEL
ALT: 34 U/L (ref 0–44)
AST: 31 U/L (ref 15–41)
Albumin: 3 g/dL — ABNORMAL LOW (ref 3.5–5.0)
Alkaline Phosphatase: 220 U/L — ABNORMAL HIGH (ref 38–126)
Anion gap: 12 (ref 5–15)
BUN: 14 mg/dL (ref 6–20)
CO2: 17 mmol/L — ABNORMAL LOW (ref 22–32)
Calcium: 8.8 mg/dL — ABNORMAL LOW (ref 8.9–10.3)
Chloride: 109 mmol/L (ref 98–111)
Creatinine, Ser: 0.95 mg/dL (ref 0.44–1.00)
GFR calc Af Amer: >60 mL/min (ref >60)
GFR calc non Af Amer: >60 mL/min (ref >60)
Glucose, Bld: 77 mg/dL (ref 70–99)
Potassium: 3.9 mmol/L (ref 3.5–5.1)
Sodium: 138 mmol/L (ref 135–145)
Total Bilirubin: 0.5 mg/dL (ref 0.3–1.2)
Total Protein: 6 g/dL — ABNORMAL LOW (ref 6.5–8.1)

## 2019-05-28 LAB — CBC
HCT: 32.9 % — ABNORMAL LOW (ref 36.0–46.0)
HCT: 34.2 % — ABNORMAL LOW (ref 36.0–46.0)
Hemoglobin: 10.5 g/dL — ABNORMAL LOW (ref 12.0–15.0)
Hemoglobin: 11.1 g/dL — ABNORMAL LOW (ref 12.0–15.0)
MCH: 27.4 pg (ref 26.0–34.0)
MCH: 27.8 pg (ref 26.0–34.0)
MCHC: 31.9 g/dL (ref 30.0–36.0)
MCHC: 32.5 g/dL (ref 30.0–36.0)
MCV: 85.9 fL (ref 80.0–100.0)
MCV: 85.7 fL (ref 80.0–100.0)
Platelets: 160 10*3/uL (ref 150–400)
Platelets: 162 10*3/uL (ref 150–400)
RBC: 3.83 MIL/uL — ABNORMAL LOW (ref 3.87–5.11)
RBC: 3.99 MIL/uL (ref 3.87–5.11)
RDW: 15.8 % — ABNORMAL HIGH (ref 11.5–15.5)
RDW: 15.9 % — ABNORMAL HIGH (ref 11.5–15.5)
WBC: 5.7 10*3/uL (ref 4.0–10.5)
WBC: 8.4 10*3/uL (ref 4.0–10.5)
nRBC: 0.4 % — ABNORMAL HIGH (ref 0.0–0.2)
nRBC: 0.2 % (ref 0.0–0.2)

## 2019-05-28 LAB — PROTEIN / CREATININE RATIO, URINE
Creatinine, Urine: 59.81 mg/dL
Protein Creatinine Ratio: 0.77 mg/mg{Cre} — ABNORMAL HIGH (ref 0.00–0.15)
Total Protein, Urine: 46 mg/dL

## 2019-05-28 LAB — POCT FERN TEST: POCT Fern Test: NEGATIVE

## 2019-05-28 LAB — ABO/RH: ABO/RH(D): O POS

## 2019-05-28 LAB — SARS CORONAVIRUS 2 (TAT 6-24 HRS): SARS Coronavirus 2: NEGATIVE

## 2019-05-28 MED ORDER — ZOLPIDEM TARTRATE 5 MG PO TABS: 5.0000 mg | ORAL_TABLET | Freq: Every evening | ORAL | Status: DC | PRN

## 2019-05-28 MED ORDER — FENTANYL-BUPIVACAINE-NACL 0.5-0.125-0.9 MG/250ML-% EP SOLN
12.0000 mL/h | EPIDURAL | Status: DC | PRN
  Filled 2019-05-28: qty 250

## 2019-05-28 MED ORDER — LIDOCAINE HCL (PF) 1 % IJ SOLN
INTRAMUSCULAR | Status: DC | PRN
  Administered 2019-05-28: 4 mL via EPIDURAL
  Administered 2019-05-28: 5 mL via EPIDURAL

## 2019-05-28 MED ORDER — LACTATED RINGERS IV SOLN: INTRAVENOUS | Status: DC

## 2019-05-28 MED ORDER — OXYCODONE-ACETAMINOPHEN 5-325 MG PO TABS: 2.0000 | ORAL_TABLET | ORAL | Status: DC | PRN

## 2019-05-28 MED ORDER — ACETAMINOPHEN 325 MG PO TABS: 650.0000 mg | ORAL_TABLET | ORAL | Status: DC | PRN

## 2019-05-28 MED ORDER — SOD CITRATE-CITRIC ACID 500-334 MG/5ML PO SOLN: 30.0000 mL | ORAL | Status: DC | PRN

## 2019-05-28 MED ORDER — LACTATED RINGERS IV SOLN: 500.0000 mL | INTRAVENOUS | Status: DC | PRN

## 2019-05-28 MED ORDER — OXYTOCIN 40 UNITS IN NORMAL SALINE INFUSION - SIMPLE MED
1.0000 m[IU]/min | INTRAVENOUS | Status: DC
  Administered 2019-05-28: 2 m[IU]/min via INTRAVENOUS
  Filled 2019-05-28: qty 1000

## 2019-05-28 MED ORDER — FENTANYL CITRATE (PF) 100 MCG/2ML IJ SOLN
50.0000 ug | INTRAMUSCULAR | Status: DC | PRN
  Administered 2019-05-28: 100 ug via INTRAVENOUS
  Filled 2019-05-28: qty 2

## 2019-05-28 MED ORDER — ONDANSETRON HCL 4 MG/2ML IJ SOLN
4.0000 mg | Freq: Four times a day (QID) | INTRAMUSCULAR | Status: DC | PRN
  Administered 2019-05-28: 4 mg via INTRAVENOUS
  Filled 2019-05-28: qty 2

## 2019-05-28 MED ORDER — PHENYLEPHRINE 40 MCG/ML (10ML) SYRINGE FOR IV PUSH (FOR BLOOD PRESSURE SUPPORT): 80.0000 ug | PREFILLED_SYRINGE | INTRAVENOUS | Status: DC | PRN

## 2019-05-28 MED ORDER — FLEET ENEMA 7-19 GM/118ML RE ENEM: 1.0000 | ENEMA | Freq: Every day | RECTAL | Status: DC | PRN

## 2019-05-28 MED ORDER — EPHEDRINE 5 MG/ML INJ: 10.0000 mg | INTRAVENOUS | Status: DC | PRN

## 2019-05-28 MED ORDER — LACTATED RINGERS IV SOLN
500.0000 mL | Freq: Once | INTRAVENOUS | Status: AC
  Administered 2019-05-28: 500 mL via INTRAVENOUS

## 2019-05-28 MED ORDER — OXYCODONE-ACETAMINOPHEN 5-325 MG PO TABS: 1.0000 | ORAL_TABLET | ORAL | Status: DC | PRN

## 2019-05-28 MED ORDER — OXYTOCIN BOLUS FROM INFUSION
500.0000 mL | Freq: Once | INTRAVENOUS | Status: AC
  Administered 2019-05-29: 500 mL via INTRAVENOUS

## 2019-05-28 MED ORDER — SODIUM CHLORIDE (PF) 0.9 % IJ SOLN
INTRAMUSCULAR | Status: DC | PRN
  Administered 2019-05-28: 12 mL/h via EPIDURAL

## 2019-05-28 MED ORDER — LIDOCAINE HCL (PF) 1 % IJ SOLN: 30.0000 mL | INTRAMUSCULAR | Status: DC | PRN

## 2019-05-28 MED ORDER — HYDROXYZINE HCL 50 MG PO TABS: 50.0000 mg | ORAL_TABLET | Freq: Four times a day (QID) | ORAL | Status: DC | PRN

## 2019-05-28 MED ORDER — OXYTOCIN 40 UNITS IN NORMAL SALINE INFUSION - SIMPLE MED: 2.5000 [IU]/h | INTRAVENOUS | Status: DC

## 2019-05-28 MED ORDER — DIPHENHYDRAMINE HCL 50 MG/ML IJ SOLN: 12.5000 mg | INTRAMUSCULAR | Status: DC | PRN

## 2019-05-28 MED ORDER — TERBUTALINE SULFATE 1 MG/ML IJ SOLN: 0.2500 mg | Freq: Once | INTRAMUSCULAR | Status: DC | PRN

## 2019-05-28 NOTE — Progress Notes (Signed)
LABOR PROGRESS NOTE  Adriana Hahn is a 29 y.o. G1P0000 at [redacted]w[redacted]d  admitted for IOL for PreE.  Subjective: Feeling much better after epidural  Objective: BP (!) 148/93   Pulse 82   Temp 98.9 F (37.2 C) (Oral)   Resp (!) 21   Ht 5\' 1"  (1.549 m)   Wt 72 kg   LMP 09/02/2018 (Exact Date)   SpO2 100%   BMI 29.99 kg/m  or  Vitals:   05/28/19 1956 05/28/19 2001 05/28/19 2031 05/28/19 2101  BP: 134/74 140/73 (!) 151/101 (!) 148/93  Pulse: (!) 59 64 70 82  Resp: (!) 21     Temp: 98.9 F (37.2 C)     TempSrc: Oral     SpO2:      Weight:      Height:         Dilation: 5.5 Effacement (%): 90 Cervical Position: Posterior Station: -2 Presentation: Vertex Exam by:: Mary [phone] Johnson, RN  FHT: baseline rate 145, moderate varibility, +acel, -decel Toco: q2-3 min  Labs: Lab Results  Component Value Date   WBC 8.4 05/28/2019   HGB 11.1 (L) 05/28/2019   HCT 34.2 (L) 05/28/2019   MCV 85.7 05/28/2019   PLT 162 05/28/2019    Patient Active Problem List   Diagnosis Date Noted  . [redacted] weeks gestation of pregnancy 05/28/2019  . Preeclampsia 05/28/2019  . Gestational diabetes mellitus (GDM) 05/28/2019  . Gestational diabetes   . Bacteriuria during pregnancy 04/10/2019  . Language barrier affecting health care 03/26/2019  . PCOS (polycystic ovarian syndrome) 12/08/2018  . ASCUS of cervix with negative high risk HPV 12/08/2018  . Encounter for supervision of high risk pregnancy in third trimester, antepartum 11/27/2018  . Infertility, female 07/12/2016    Assessment / Plan: 29 y.o. G1P0000 at [redacted]w[redacted]d here for IOL for PreE.  Labor: s/p FB and started on pitocin at 1645, progressing well. Recheck in 4 hours, consider AROM pending progress Fetal Wellbeing:  Cat I Pain Control:  epidural GBS: neg Anticipated MOD:  SVD  PreE w/o SF: BP's mostly mild range, ctm symptoms. Labs notable for borderline values of Cr 0.95 and Plts 162.  GDMA1: CBG q4 latent labor q2h active labor,  most recently 62>90  [redacted]w[redacted]d, MD/MPH OB Fellow  05/28/2019, 10:58 PM

## 2019-05-28 NOTE — Anesthesia Preprocedure Evaluation (Signed)
Anesthesia Evaluation  Patient identified by MRN, date of birth, ID band Patient awake    Reviewed: Allergy & Precautions, NPO status , Patient's Chart, lab work & pertinent test results  History of Anesthesia Complications Negative for: history of anesthetic complications  Airway Mallampati: II   Neck ROM: Full    Dental   Pulmonary neg pulmonary ROS,    Pulmonary exam normal        Cardiovascular hypertension (Pre-E), Normal cardiovascular exam     Neuro/Psych  Headaches, negative psych ROS   GI/Hepatic negative GI ROS, Neg liver ROS,   Endo/Other  diabetes, Gestational  Renal/GU negative Renal ROS     Musculoskeletal negative musculoskeletal ROS (+)   Abdominal   Peds  Hematology  (+) anemia ,  Plt 162k    Anesthesia Other Findings Covid neg 05/28/19   Reproductive/Obstetrics (+) Pregnancy  PCOS                              Anesthesia Physical Anesthesia Plan  ASA: II  Anesthesia Plan: Epidural   Post-op Pain Management:    Induction:   PONV Risk Score and Plan: 2 and Treatment may vary due to age or medical condition  Airway Management Planned: Natural Airway  Additional Equipment: None  Intra-op Plan:   Post-operative Plan:   Informed Consent: I have reviewed the patients History and Physical, chart, labs and discussed the procedure including the risks, benefits and alternatives for the proposed anesthesia with the patient or authorized representative who has indicated his/her understanding and acceptance.       Plan Discussed with: Anesthesiologist  Anesthesia Plan Comments: (Labs reviewed. Platelets acceptable, patient not taking any blood thinning medications. Per RN, FHR tracing reported to be stable enough for sitting procedure. Risks and benefits discussed with patient, including PDPH, backache, epidural hematoma, failed epidural, blood pressure changes,  allergic reaction, and nerve injury. Patient expressed understanding and wished to proceed.)        Anesthesia Quick Evaluation

## 2019-05-28 NOTE — MAU Provider Note (Signed)
History     CSN: [id number]  Arrival date and time: 05/28/19 1200   First Provider Initiated Contact with Patient 05/28/19 1253      Chief Complaint  Patient presents with  . Contractions  . Rupture of Membranes   HPI Adriana Hahn is a 29 y.o. G1P0000 at [redacted]w[redacted]d who presents to MAU for RN labor evaluation and was determined to have new onset elevated blood pressure and an intermittent headache. Patient denies vaginal bleeding, leaking of fluid, decreased fetal movement, fever, falls, or recent illness. She endorses history of recurrent headaches which began in the fifth month of her pregnancy. She denies headaches on initial CNM assessment. She denies visual disturbances, RUQ/epigastric pain, new onset weight gain or swelling.  Patient states her headaches flare when she skips meals. She requests small snack prior to Tylenol to assist with relieving new onset headache at 1405.  OB History    Gravida  1   Para  0   Term  0   Preterm  0   AB  0   Living  0     SAB  0   TAB  0   Ectopic  0   Multiple  0   Live Births  0           Past Medical History:  Diagnosis Date  . Gestational diabetes   . Migraines   . PCOS (polycystic ovarian syndrome)     Past Surgical History:  Procedure Laterality Date  . NO PAST SURGERIES      Family History  Problem Relation Age of Onset  . Diabetes Mother     Social History   Tobacco Use  . Smoking status: Never Smoker  . Smokeless tobacco: Never Used  Substance Use Topics  . Alcohol use: No  . Drug use: No    Allergies:  Allergies  Allergen Reactions  . Almond (Diagnostic) Itching  . Black Walnut Flavor Itching    Medications Prior to Admission  Medication Sig Dispense Refill Last Dose  . aspirin EC 81 MG tablet Take 1 tablet (81 mg total) by mouth daily. 30 tablet 5 05/27/2019 at Unknown time  . Prenatal Vit-Fe Fumarate-FA (PREPLUS) 27-1 MG TABS Take 1 tablet by mouth daily.   05/27/2019 at Unknown time  .  acetaminophen (TYLENOL) 500 MG tablet Take 500 mg by mouth every 6 (six) hours as needed.   More than a month at Unknown time    Review of Systems  Eyes: Negative for photophobia and visual disturbance.  Respiratory: Negative for shortness of breath.   Gastrointestinal: Positive for abdominal pain.  Genitourinary: Negative for vaginal bleeding, vaginal discharge and vaginal pain.  Musculoskeletal: Negative for back pain.  Neurological: Positive for headaches.  All other systems reviewed and are negative.  Physical Exam   Blood pressure 121/90, pulse 76, temperature 98.3 F (36.8 C), temperature source Oral, resp. rate 16, height 5\' 1"  (1.549 m), weight 72 kg, last menstrual period 09/02/2018, SpO2 100 %.  Physical Exam  Nursing note and vitals reviewed. Constitutional: She is oriented to person, place, and time. She appears well-developed and well-nourished.  Cardiovascular: Normal rate.  Respiratory: Effort normal and breath sounds normal.  GI:  Gravid  Genitourinary:    Genitourinary Comments: Negative pooling negative fern   Neurological: She is alert and oriented to person, place, and time.  Skin: Skin is warm and dry.  Psychiatric: She has a normal mood and affect. Her behavior is normal. Judgment and thought  content normal.    MAU Course/MDM  Procedures  Patient Vitals for the past 24 hrs:  BP Temp Temp src Pulse Resp SpO2 Height Weight  05/28/19 1331 127/84 -- -- 64 -- -- -- --  05/28/19 1330 -- -- -- -- -- 100 % -- --  05/28/19 1316 115/85 -- -- -- -- -- -- --  05/28/19 1301 120/87 -- -- -- -- -- -- --  05/28/19 1246 121/90 -- -- 76 -- -- -- --  05/28/19 1235 110/88 -- -- -- -- -- -- --  05/28/19 1213 (!) 129/92 98.3 F (36.8 C) Oral 91 16 100 % 5\' 1"  (1.549 m) 72 kg   Results for orders placed or performed during the hospital encounter of 05/28/19 (from the past 24 hour(s))  POCT fern test     Status: Normal   Collection Time: 05/28/19 12:50 PM  Result Value  Ref Range   POCT Fern Test Negative = intact amniotic membranes   Protein / creatinine ratio, urine     Status: Abnormal   Collection Time: 05/28/19 12:51 PM  Result Value Ref Range   Creatinine, Urine 59.81 mg/dL   Total Protein, Urine 46 mg/dL   Protein Creatinine Ratio 0.77 (H) 0.00 - 0.15 mg/mg[Cre]  Urinalysis, Routine w reflex microscopic     Status: Abnormal   Collection Time: 05/28/19 12:53 PM  Result Value Ref Range   Color, Urine YELLOW YELLOW   APPearance HAZY (A) CLEAR   Specific Gravity, Urine 1.008 1.005 - 1.030   pH 6.0 5.0 - 8.0   Glucose, UA NEGATIVE NEGATIVE mg/dL   Hgb urine dipstick NEGATIVE NEGATIVE   Bilirubin Urine NEGATIVE NEGATIVE   Ketones, ur NEGATIVE NEGATIVE mg/dL   Protein, ur 30 (A) NEGATIVE mg/dL   Nitrite NEGATIVE NEGATIVE   Leukocytes,Ua NEGATIVE NEGATIVE   WBC, UA 0-5 0 - 5 WBC/hpf   Bacteria, UA RARE (A) NONE SEEN   Squamous Epithelial / LPF 11-20 0 - 5  CBC     Status: Abnormal   Collection Time: 05/28/19  1:02 PM  Result Value Ref Range   WBC 5.7 4.0 - 10.5 K/uL   RBC 3.83 (L) 3.87 - 5.11 MIL/uL   Hemoglobin 10.5 (L) 12.0 - 15.0 g/dL   HCT 32.9 (L) 36.0 - 46.0 %   MCV 85.9 80.0 - 100.0 fL   MCH 27.4 26.0 - 34.0 pg   MCHC 31.9 30.0 - 36.0 g/dL   RDW 15.8 (H) 11.5 - 15.5 %   Platelets 160 150 - 400 K/uL   nRBC 0.4 (H) 0.0 - 0.2 %  Comprehensive metabolic panel     Status: Abnormal   Collection Time: 05/28/19  1:02 PM  Result Value Ref Range   Sodium 138 135 - 145 mmol/L   Potassium 3.9 3.5 - 5.1 mmol/L   Chloride 109 98 - 111 mmol/L   CO2 17 (L) 22 - 32 mmol/L   Glucose, Bld 77 70 - 99 mg/dL   BUN 14 6 - 20 mg/dL   Creatinine, Ser 0.95 0.44 - 1.00 mg/dL   Calcium 8.8 (L) 8.9 - 10.3 mg/dL   Total Protein 6.0 (L) 6.5 - 8.1 g/dL   Albumin 3.0 (L) 3.5 - 5.0 g/dL   AST 31 15 - 41 U/L   ALT 34 0 - 44 U/L   Alkaline Phosphatase 220 (H) 38 - 126 U/L   Total Bilirubin 0.5 0.3 - 1.2 mg/dL   GFR calc non Af Amer >60 >60 mL/min  GFR  calc Af Amer >60 >60 mL/min   Anion gap 12 5 - 15  Type and screen     Status: None   Collection Time: 05/28/19  1:02 PM  Result Value Ref Range   ABO/RH(D) O POS    Antibody Screen NEG    Sample Expiration      05/31/2019,2359 Performed at [facility], [address] [place], [place] [address]   ABO/Rh     Status: None (Preliminary result)   Collection Time: 05/28/19  1:02 PM  Result Value Ref Range   ABO/RH(D)      O POS Performed at [facility], [address], [place], [place] [address]    Assessment and Plan  --29 y.o. G1P0000 at [redacted]w[redacted]d  --Cat I tracing --Intact --Admit to L&D for Preeclampsia, report given to C. Fair, MD  Language barrier: iPad spanish interpreter utilized for all patient interaction  Calvert Cantor, [place] 05/28/2019, 2:10 PM

## 2019-05-28 NOTE — Progress Notes (Signed)
Ordered pt lunch, by Orlan Leavens Spanis Interpreter.

## 2019-05-28 NOTE — Anesthesia Procedure Notes (Signed)
Epidural Patient location during procedure: OB Start time: 05/28/2019 9:49 PM End time: 05/28/2019 9:52 PM  Staffing Anesthesiologist: Beryle Lathe, MD Performed: anesthesiologist   Preanesthetic Checklist Completed: patient identified, IV checked, risks and benefits discussed, monitors and equipment checked, pre-op evaluation and timeout performed  Epidural Patient position: sitting Prep: DuraPrep Patient monitoring: continuous pulse ox and blood pressure Approach: midline Location: L2-L3 Injection technique: LOR saline  Needle:  Needle type: Tuohy  Needle gauge: 17 G Needle length: 9 cm Needle insertion depth: 5 cm Catheter size: 19 Gauge Catheter at skin depth: 10 cm Test dose: negative and Other (1% lidocaine)  Assessment Events: blood not aspirated  Additional Notes Patient identified. Risks including, but not limited to, bleeding, infection, nerve damage, paralysis, inadequate analgesia, blood pressure changes, nausea, vomiting, allergic reaction, postpartum back pain, itching, and headache were discussed. Patient expressed understanding and wished to proceed. Sterile prep and drape, including hand hygiene, mask, and sterile gloves were used. The patient was positioned and the spine was prepped. The skin was anesthetized with lidocaine. No paraesthesia or other complication noted. The patient did not experience any signs of intravascular injection such as tinnitus or metallic taste in mouth, nor signs of intrathecal spread such as rapid motor block. Please see nursing notes for vital signs. The patient tolerated the procedure well.   Leslye Peer, MDReason for block:procedure for pain

## 2019-05-28 NOTE — MAU Note (Signed)
Contractions started at 0400, <24min, getting stronger and closer. 0400, started leaking water only noted one time yellow fluid, pink when wiped.

## 2019-05-28 NOTE — H&P (Signed)
OBSTETRIC ADMISSION HISTORY AND PHYSICAL  Adriana Hahn is a 29 y.o. female G1P0000 with IUP at [redacted]w[redacted]d by IUI presenting for ctx and found to have Pre-E. She does report a headache which resolved during MAU stay. She reports +FMs, No LOF, no VB, no blurry vision or peripheral edema, and RUQ pain.  She plans on breast feeding. She is undecided for birth control. She received her prenatal care at [facility]   Dating: By IUI --->  Estimated Date of Delivery: 06/09/19  Sono:  2/9  @[redacted]w[redacted]d , CWD, normal anatomy, cephalic presentation, anterior placental lie, 3445g, 93% EFW  Prenatal History/Complications: GDMA1 Preeclampsia Language barrier  PCOS Hx of migraines  Past Medical History: Past Medical History:  Diagnosis Date  . Gestational diabetes   . Migraines   . PCOS (polycystic ovarian syndrome)     Past Surgical History: Past Surgical History:  Procedure Laterality Date  . NO PAST SURGERIES      Obstetrical History: OB History    Gravida  1   Para  0   Term  0   Preterm  0   AB  0   Living  0     SAB  0   TAB  0   Ectopic  0   Multiple  0   Live Births  0           Social History: Social History   Socioeconomic History  . Marital status: Married    Spouse name:  . Number of children: 0  . Years of education: 16  . Highest education level: High school graduate  Occupational History  . Occupation: unemployed  Tobacco Use  . Smoking status: Never Smoker  . Smokeless tobacco: Never Used  Substance and Sexual Activity  . Alcohol use: No  . Drug use: No  . Sexual activity: Yes    Partners: Male    Birth control/protection: None  Other Topics Concern  . Not on file  Social History Narrative  . Not on file   Social Determinants of Health   Financial Resource Strain:   . Difficulty of Paying Living Expenses: Not on file  Food Insecurity: No Food Insecurity  . Worried About 14 in the Last Year: Never true  . Ran Out  of Food in the Last Year: Never true  Transportation Needs: No Transportation Needs  . Lack of Transportation (Medical): No  . Lack of Transportation (Non-Medical): No  Physical Activity:   . Days of Exercise per Week: Not on file  . Minutes of Exercise per Session: Not on file  Stress:   . Feeling of Stress : Not on file  Social Connections:   . Frequency of Communication with Friends and Family: Not on file  . Frequency of Social Gatherings with Friends and Family: Not on file  . Attends Religious Services: Not on file  . Active Member of Clubs or Organizations: Not on file  . Attends Programme researcher, broadcasting/film/video Meetings: Not on file  . Marital Status: Not on file    Family History: Family History  Problem Relation Age of Onset  . Diabetes Mother     Allergies: Allergies  Allergen Reactions  . Cherry Anaphylaxis  . Almond (Diagnostic) Itching  . Apple Itching    Red apples cause this more than other varieties of apples  . Black Walnut Flavor Itching    Medications Prior to Admission  Medication Sig Dispense Refill Last Dose  . aspirin EC 81  MG tablet Take 1 tablet (81 mg total) by mouth daily. 30 tablet 5 05/27/2019 at Unknown time  . Prenatal Vit-Fe Fumarate-FA (PREPLUS) 27-1 MG TABS Take 1 tablet by mouth daily.   05/27/2019 at Unknown time  . acetaminophen (TYLENOL) 500 MG tablet Take 500 mg by mouth every 6 (six) hours as needed.   More than a month at Unknown time     Review of Systems   All systems reviewed and negative except as stated in HPI  Blood pressure 133/84, pulse 62, temperature 98 F (36.7 C), temperature source Axillary, resp. rate 16, height 5\' 1"  (1.549 m), weight 72 kg, last menstrual period 09/02/2018, SpO2 100 %. General appearance: alert, cooperative, appears stated age and no distress Lungs: normal effort Heart: regular rate  Abdomen: soft, non-tender; bowel sounds normal Pelvic: gravid uterus GU: No vaginal lesions  Extremities: Homans sign is  negative, no sign of DVT Presentation: cephalic Fetal monitoringBaseline: 135 bpm, Variability: Good {> 6 bpm), Accelerations: Reactive and Decelerations: Absent Uterine activity: Frequency: Every 2-5 minutes Dilation: Fingertip Effacement (%): 80 Station: -3 Exam by:: Dr. Marice Potter   Prenatal labs: ABO, Rh: --/--/O POS, O POS Performed at [facility], [address], [place], [place] [address]  [phone]) Antibody: NEG (02/22 1302) Rubella: 1.68 (09/04 1128) RPR: Non Reactive (11/23 1645)  HBsAg: Negative (09/04 1128)  HIV: Non Reactive (11/23 1645)  GBS: --Henderson Cloud (02/10 1037)  2 hr Glucola abnormal Genetic screening  declined Anatomy US WNL  Prenatal Transfer Tool  Maternal Diabetes: No Genetic Screening: Declined Maternal Ultrasounds/Referrals: Normal Fetal Ultrasounds or other Referrals:  None Maternal Substance Abuse:  No Significant Maternal Medications:  None Significant Maternal Lab Results: Group B Strep negative  Results for orders placed or performed during the hospital encounter of 05/28/19 (from the past 24 hour(s))  POCT fern test   Collection Time: 05/28/19 12:50 PM  Result Value Ref Range   POCT Fern Test Negative = intact amniotic membranes   Protein / creatinine ratio, urine   Collection Time: 05/28/19 12:51 PM  Result Value Ref Range   Creatinine, Urine 59.81 mg/dL   Total Protein, Urine 46 mg/dL   Protein Creatinine Ratio 0.77 (H) 0.00 - 0.15 mg/mg[Cre]  Urinalysis, Routine w reflex microscopic   Collection Time: 05/28/19 12:53 PM  Result Value Ref Range   Color, Urine YELLOW YELLOW   APPearance HAZY (A) CLEAR   Specific Gravity, Urine 1.008 1.005 - 1.030   pH 6.0 5.0 - 8.0   Glucose, UA NEGATIVE NEGATIVE mg/dL   Hgb urine dipstick NEGATIVE NEGATIVE   Bilirubin Urine NEGATIVE NEGATIVE   Ketones, ur NEGATIVE NEGATIVE mg/dL   Protein, ur 30 (A) NEGATIVE mg/dL   Nitrite NEGATIVE NEGATIVE   Leukocytes,Ua NEGATIVE NEGATIVE   WBC, UA 0-5  0 - 5 WBC/hpf   Bacteria, UA RARE (A) NONE SEEN   Squamous Epithelial / LPF 11-20 0 - 5  CBC   Collection Time: 05/28/19  1:02 PM  Result Value Ref Range   WBC 5.7 4.0 - 10.5 K/uL   RBC 3.83 (L) 3.87 - 5.11 MIL/uL   Hemoglobin 10.5 (L) 12.0 - 15.0 g/dL   HCT 32.9 (L) 36.0 - 46.0 %   MCV 85.9 80.0 - 100.0 fL   MCH 27.4 26.0 - 34.0 pg   MCHC 31.9 30.0 - 36.0 g/dL   RDW 15.8 (H) 11.5 - 15.5 %   Platelets 160 150 - 400 K/uL   nRBC 0.4 (H) 0.0 -  0.2 %  Comprehensive metabolic panel   Collection Time: 05/28/19  1:02 PM  Result Value Ref Range   Sodium 138 135 - 145 mmol/L   Potassium 3.9 3.5 - 5.1 mmol/L   Chloride 109 98 - 111 mmol/L   CO2 17 (L) 22 - 32 mmol/L   Glucose, Bld 77 70 - 99 mg/dL   BUN 14 6 - 20 mg/dL   Creatinine, Ser 1.61 0.44 - 1.00 mg/dL   Calcium 8.8 (L) 8.9 - 10.3 mg/dL   Total Protein 6.0 (L) 6.5 - 8.1 g/dL   Albumin 3.0 (L) 3.5 - 5.0 g/dL   AST 31 15 - 41 U/L   ALT 34 0 - 44 U/L   Alkaline Phosphatase 220 (H) 38 - 126 U/L   Total Bilirubin 0.5 0.3 - 1.2 mg/dL   GFR calc non Af Amer >60 >60 mL/min   GFR calc Af Amer >60 >60 mL/min   Anion gap 12 5 - 15  Type and screen   Collection Time: 05/28/19  1:02 PM  Result Value Ref Range   ABO/RH(D) O POS    Antibody Screen NEG    Sample Expiration      05/31/2019,2359 Performed at [facility], [address], [place], [place] [address]   ABO/Rh   Collection Time: 05/28/19  1:02 PM  Result Value Ref Range   ABO/RH(D)      O POS Performed at [facility], [address] [place], [place] [address]   Glucose, capillary   Collection Time: 05/28/19  3:14 PM  Result Value Ref Range   Glucose-Capillary 62 (L) 70 - 99 mg/dL    Patient Active Problem List   Diagnosis Date Noted  . [redacted] weeks gestation of pregnancy 05/28/2019  . Preeclampsia 05/28/2019  . Gestational diabetes mellitus (GDM) 05/28/2019  . Gestational diabetes   . Bacteriuria during pregnancy 04/10/2019  . Language barrier  affecting health care 03/26/2019  . PCOS (polycystic ovarian syndrome) 12/08/2018  . ASCUS of cervix with negative high risk HPV 12/08/2018  . Encounter for supervision of high risk pregnancy in third trimester, antepartum 11/27/2018  . Infertility, female 07/12/2016    Assessment/Plan:  Minola Guin is a 29 y.o. G1P0000 at [redacted]w[redacted]d here for IOL for Pre-E.  #Labor: Vertex by exam. Foley bulb placed manually with 60 mL water and will start Pitocin due to cervical effacement. AROM as indicated. Anticipate SVD. #Pain: Per patient request #FWB: Cat I; EFW: 3800g #ID:  GBS neg #MOF: Breast #MOC: Undecided; discussed options #Circ:  NA; girl #GDMA1: CBG's q4h latent, q2h active labor. A1c 5.2 in Sept 2020. #Pre-E: Pr/Cr 0.77. CMP WNL. Serial BP's. Reports hx of migraines outside of pregnancy. Headache currently resolved without medication.   Jerilynn Birkenhead, MD [facility] Family Medicine Fellow, [facility] for [facility], [facility] 05/28/2019, 4:40 PM

## 2019-05-29 ENCOUNTER — Encounter (HOSPITAL_COMMUNITY): Payer: Self-pay | Admitting: Obstetrics & Gynecology

## 2019-05-29 ENCOUNTER — Ambulatory Visit (HOSPITAL_COMMUNITY): Payer: Self-pay

## 2019-05-29 DIAGNOSIS — O24420 Gestational diabetes mellitus in childbirth, diet controlled: Secondary | ICD-10-CM

## 2019-05-29 LAB — CBC WITH DIFFERENTIAL/PLATELET
Abs Immature Granulocytes: 0.23 10*3/uL — ABNORMAL HIGH (ref 0.00–0.07)
Basophils Absolute: 0 10*3/uL (ref 0.0–0.1)
Basophils Relative: 0 %
Eosinophils Absolute: 0 10*3/uL (ref 0.0–0.5)
Eosinophils Relative: 0 %
HCT: 31.7 % — ABNORMAL LOW (ref 36.0–46.0)
Hemoglobin: 10.2 g/dL — ABNORMAL LOW (ref 12.0–15.0)
Immature Granulocytes: 2 %
Lymphocytes Relative: 11 %
Lymphs Abs: 1.7 10*3/uL (ref 0.7–4.0)
MCH: 27.5 pg (ref 26.0–34.0)
MCHC: 32.2 g/dL (ref 30.0–36.0)
MCV: 85.4 fL (ref 80.0–100.0)
Monocytes Absolute: 0.6 10*3/uL (ref 0.1–1.0)
Monocytes Relative: 4 %
Neutro Abs: 12.5 10*3/uL — ABNORMAL HIGH (ref 1.7–7.7)
Neutrophils Relative %: 83 %
Platelets: 149 10*3/uL — ABNORMAL LOW (ref 150–400)
RBC: 3.71 MIL/uL — ABNORMAL LOW (ref 3.87–5.11)
RDW: 16 % — ABNORMAL HIGH (ref 11.5–15.5)
WBC: 15.1 10*3/uL — ABNORMAL HIGH (ref 4.0–10.5)
nRBC: 0.2 % (ref 0.0–0.2)

## 2019-05-29 LAB — GLUCOSE, CAPILLARY: Glucose-Capillary: 114 mg/dL — ABNORMAL HIGH (ref 70–99)

## 2019-05-29 LAB — RPR: RPR Ser Ql: NONREACTIVE

## 2019-05-29 MED ORDER — SODIUM CHLORIDE 0.9 % IV BOLUS
500.0000 mL | Freq: Once | INTRAVENOUS | Status: AC
  Administered 2019-05-29: 500 mL via INTRAVENOUS

## 2019-05-29 MED ORDER — IBUPROFEN 600 MG PO TABS
600.0000 mg | ORAL_TABLET | Freq: Four times a day (QID) | ORAL | Status: DC
  Administered 2019-05-29 – 2019-05-31 (×8): 600 mg via ORAL
  Filled 2019-05-29 (×8): qty 1

## 2019-05-29 MED ORDER — OXYCODONE HCL 5 MG PO TABS: 5.0000 mg | ORAL_TABLET | ORAL | Status: DC | PRN

## 2019-05-29 MED ORDER — BENZOCAINE-MENTHOL 20-0.5 % EX AERO: 1.0000 "application " | INHALATION_SPRAY | CUTANEOUS | Status: DC | PRN

## 2019-05-29 MED ORDER — DIBUCAINE (PERIANAL) 1 % EX OINT: 1.0000 "application " | TOPICAL_OINTMENT | CUTANEOUS | Status: DC | PRN

## 2019-05-29 MED ORDER — ONDANSETRON HCL 4 MG/2ML IJ SOLN: 4.0000 mg | INTRAMUSCULAR | Status: DC | PRN

## 2019-05-29 MED ORDER — OXYCODONE HCL 5 MG PO TABS: 10.0000 mg | ORAL_TABLET | ORAL | Status: DC | PRN

## 2019-05-29 MED ORDER — ONDANSETRON HCL 4 MG PO TABS: 4.0000 mg | ORAL_TABLET | ORAL | Status: DC | PRN

## 2019-05-29 MED ORDER — MISOPROSTOL 200 MCG PO TABS
800.0000 ug | ORAL_TABLET | Freq: Once | ORAL | Status: AC
  Administered 2019-05-29: 800 ug via ORAL
  Filled 2019-05-29: qty 4

## 2019-05-29 MED ORDER — DIPHENHYDRAMINE HCL 25 MG PO CAPS: 25.0000 mg | ORAL_CAPSULE | Freq: Four times a day (QID) | ORAL | Status: DC | PRN

## 2019-05-29 MED ORDER — WITCH HAZEL-GLYCERIN EX PADS: 1.0000 "application " | MEDICATED_PAD | CUTANEOUS | Status: DC | PRN

## 2019-05-29 MED ORDER — SIMETHICONE 80 MG PO CHEW: 80.0000 mg | CHEWABLE_TABLET | ORAL | Status: DC | PRN

## 2019-05-29 MED ORDER — PRENATAL MULTIVITAMIN CH
1.0000 | ORAL_TABLET | Freq: Every day | ORAL | Status: DC
  Administered 2019-05-30: 1 via ORAL
  Filled 2019-05-29: qty 1

## 2019-05-29 MED ORDER — MAGNESIUM HYDROXIDE 400 MG/5ML PO SUSP: 30.0000 mL | ORAL | Status: DC | PRN

## 2019-05-29 MED ORDER — COCONUT OIL OIL: 1.0000 "application " | TOPICAL_OIL | Status: DC | PRN

## 2019-05-29 MED ORDER — SENNOSIDES-DOCUSATE SODIUM 8.6-50 MG PO TABS
2.0000 | ORAL_TABLET | ORAL | Status: DC
  Administered 2019-05-30 (×2): 2 via ORAL
  Filled 2019-05-29 (×2): qty 2

## 2019-05-29 MED ORDER — ACETAMINOPHEN 325 MG PO TABS
650.0000 mg | ORAL_TABLET | ORAL | Status: DC | PRN
  Administered 2019-05-29: 650 mg via ORAL
  Filled 2019-05-29: qty 2

## 2019-05-29 NOTE — Progress Notes (Signed)
5916. This RN called to inform Dr. Jackelyn Poling of pt's apical heart rate of 139 and 122 with a blood pressures of 125/98. Also informed him of 2 grape size clots noted during pt's fundal check. Received order for 500cc normal saline bolus. 1030 This RN rechecked pt's fundus and noted yellow pad to be full. Using Triton EBL in pad. Asked pt if she needed to void and pt states she did not have an urge to void. Placed pt on bedpan in an attempt to get pt to void. Pt unable to void. Per bladder scanner, pt's bladder with of urine inside. While pt on bedpan this RN noticed a small continuous tickle of blood coming from pt's vagina.  1033 This RN called Dr. Jackelyn Poling to inform him of above mentioned happenings. 1050 Jackelyn Poling at bedside. This RN showed him the amount of blood in bedpan and yellow pads. No new orders received and he will discuss happenings with Dr. Earlene Plater. In-house interpreter [place] in room to interpret.

## 2019-05-29 NOTE — Progress Notes (Signed)
LABOR PROGRESS NOTE  Aerika Groll is a 29 y.o. G1P0000 at [redacted]w[redacted]d  admitted for IOL for PreE.  Subjective: Not feeling much with contractions No rectal pressure  Objective: BP (!) 147/89   Pulse 85   Temp 98.3 F (36.8 C) (Oral)   Resp 18   Ht 5\' 1"  (1.549 m)   Wt 72 kg   LMP 09/02/2018 (Exact Date)   SpO2 100%   BMI 29.99 kg/m  or  Vitals:   05/29/19 0330 05/29/19 0401 05/29/19 0430 05/29/19 0500  BP: (!) 138/92 (!) 148/112 (!) 151/95 (!) 147/89  Pulse: 100 (!) 116 84 85  Resp: 16 18 16 18   Temp:      TempSrc:      SpO2:      Weight:      Height:         Dilation: 9 Effacement (%): 90 Cervical Position: Posterior Station: 0 Presentation: Vertex Exam by:: Dr. FHT: baseline rate 155, moderate varibility, +acel, mix of early and variable decel Toco: q2-3 min  Labs: Lab Results  Component Value Date   WBC 8.4 05/28/2019   HGB 11.1 (L) 05/28/2019   HCT 34.2 (L) 05/28/2019   MCV 85.7 05/28/2019   PLT 162 05/28/2019    Patient Active Problem List   Diagnosis Date Noted  . [redacted] weeks gestation of pregnancy 05/28/2019  . Preeclampsia 05/28/2019  . Gestational diabetes mellitus (GDM) 05/28/2019  . Gestational diabetes   . Bacteriuria during pregnancy 04/10/2019  . Language barrier affecting health care 03/26/2019  . PCOS (polycystic ovarian syndrome) 12/08/2018  . ASCUS of cervix with negative high risk HPV 12/08/2018  . Encounter for supervision of high risk pregnancy in third trimester, antepartum 11/27/2018  . Infertility, female 07/12/2016    Assessment / Plan: 29 y.o. G1P0000 at [redacted]w[redacted]d here for IOL for PreE.  Labor: s/p FB and started on pitocin at 1645, AROM for clear at 0200 and at that time was 8cm. Rechecked now, 9cm at 0 station with persistent anterior and R sided lips, infant position appears to be LOP. Will place on peanut and recheck in 90 minutes.   Fetal Wellbeing:  Cat II for some variables but overall reassuring with moderate  variability and accels Pain Control:  epidural GBS: neg Anticipated MOD:  SVD  PreE w/o SF: BP's mostly mild range, ctm symptoms. Labs notable for borderline values of Cr 0.95 and Plts 162.  GDMA1: CBG q4 latent labor q2h active labor, most recently 62>90>114  26, MD/[facility] OB Fellow  05/29/2019, 5:25 AM

## 2019-05-29 NOTE — Progress Notes (Signed)
Student RN documentation reviewed and is accurate to the best of my knowledge. Interpreter via tablet used to communicate with patient.   Lucrezia Starch RN.

## 2019-05-29 NOTE — Plan of Care (Signed)
L&D careplan completed 

## 2019-05-29 NOTE — Progress Notes (Signed)
S: Went to evaluate patient after nurse call with concern of some vaginal bleeding.  Patient without distress with appropriate abdominal discomfort. Patient spoken to via in-person interpretor   O: Patient with estimated blood of via weight from previous pad with approximately an additional 100-163ml blood in bed pan from last 45 minutes. Gen: NAD Abd: Uterine fundus firm with appropriate discomfort to palpation  Plan: - Patient discussed with Dr. Earlene Plater - Cytotec oral x 1 ordered - Patient informed to let us know if she notes increase in bleeding - Will continue to monitor

## 2019-05-29 NOTE — Discharge Summary (Signed)
Postpartum Discharge Summary     Patient Name: Adriana Hahn DOB: 07-Nov-1990 MRN: [id number]  Date of admission: 05/28/2019 Delivering Provider: Clarnce Flock   Date of discharge: 05/31/2019  Admitting diagnosis: Gestational diabetes mellitus (GDM) [O24.419] Intrauterine pregnancy: [redacted]w[redacted]d    Secondary diagnosis:  Active Problems:   Infertility, female   Language barrier affecting health care   Gestational diabetes   339weeks gestation of pregnancy   Preeclampsia   Gestational diabetes mellitus (GDM)  Additional problems:      Discharge diagnosis: Term Pregnancy Delivered, Preeclampsia (mild) and GDM A1                                                                                                Post partum procedures:IV iron transfusion  Augmentation: AROM, Pitocin and Foley Balloon  Complications: None  Hospital course:  Induction of Labor With Vaginal Delivery   29y.o. yo G1P0000 at 353w3das admitted to the hospital 05/28/2019 for induction of labor.  Indication for induction: Preeclampsia.  Patient had an uncomplicated labor course as follows: induced with foley bulb, pitocin, and AROM.  Membrane Rupture Time/Date: 2:27 AM ,05/29/2019   Intrapartum Procedures: Episiotomy: None [1]                                         Lacerations:  Periurethral [8];1st degree [2]  Patient had delivery of a Viable infant.  Information for the patient's newborn:  RaCarreen, Milius0[[id number]]Delivery Method: Vag-Spont    05/29/2019  Details of delivery can be found in separate delivery note.  Patient had a routine postpartum course. BP's were well controlled and did not require medication. Her hgb dropped from 10.2 on admission to 7.6 so she was given an IV feraheme transfusion. She declined contraception at the time of discharge. Patient is discharged home 05/31/19. Delivery time: 6:36 AM    Magnesium Sulfate received: No BMZ received:  No Rhophylac:N/A MMR:N/A Transfusion:No  Physical exam  Vitals:   05/30/19 1355 05/30/19 2129 05/30/19 2130 05/31/19 0648  BP: 138/90 (!) 142/99 (!) 138/95 127/77  Pulse: 77 71 69 (!) 59  Resp: 16 17  18   Temp: 97.9 F (36.6 C) 97.7 F (36.5 C)  98.6 F (37 C)  TempSrc: Oral Oral  Oral  SpO2:  100%  100%  Weight:      Height:       General: alert, cooperative and no distress Lochia: appropriate Uterine Fundus: firm Incision: N/A DVT Evaluation: No evidence of DVT seen on physical exam. No significant calf/ankle edema. Labs: Lab Results  Component Value Date   WBC 14.6 (H) 05/30/2019   HGB 7.6 (L) 05/30/2019   HCT 24.1 (L) 05/30/2019   MCV 88.0 05/30/2019   PLT 131 (L) 05/30/2019   CMP Latest Ref Rng & Units 05/28/2019  Glucose 70 - 99 mg/dL 77  BUN 6 - 20 mg/dL 14  Creatinine 0.44 - 1.00 mg/dL 0.95  Sodium 135 - 145 mmol/L 138  Potassium  3.5 - 5.1 mmol/L 3.9  Chloride 98 - 111 mmol/L 109  CO2 22 - 32 mmol/L 17(L)  Calcium 8.9 - 10.3 mg/dL 8.8(L)  Total Protein 6.5 - 8.1 g/dL 6.0(L)  Total Bilirubin 0.3 - 1.2 mg/dL 0.5  Alkaline Phos 38 - 126 U/L 220(H)  AST 15 - 41 U/L 31  ALT 0 - 44 U/L 34   Edinburgh Score: No flowsheet data found.  Discharge instruction: per After Visit Summary and "Baby and Me Booklet".  After visit meds:  Allergies as of 05/31/2019      Reactions   Cherry Anaphylaxis   Almond (diagnostic) Itching   Apple Itching   Red apples cause this more than other varieties of apples   Black Walnut Flavor Itching      Medication List    STOP taking these medications   aspirin EC 81 MG tablet     TAKE these medications   acetaminophen 325 MG tablet Commonly known as: Tylenol Take 2 tablets (650 mg total) by mouth every 4 (four) hours as needed (for pain scale < 4).   ibuprofen 600 MG tablet Commonly known as: ADVIL Take 1 tablet (600 mg total) by mouth every 6 (six) hours.   polyethylene glycol powder 17 GM/SCOOP powder Commonly  known as: GLYCOLAX/MIRALAX Take 255 g by mouth once for 1 dose.   PrePLUS 27-1 MG Tabs Take 1 tablet by mouth daily.       Diet: routine diet  Activity: Advance as tolerated. Pelvic rest for 6 weeks.   Outpatient follow up:6 weeks Follow up Appt: Future Appointments  Date Time Provider [place]  06/07/2019  2:30 PM [place] Gar[place]  07/02/2019  8:20 AM [facility] WOC  07/02/2019  9:35 AM [place], Rona Ravens, NP [facility] WOC   Follow up Visit:   Please schedule this patient for Postpartum visit in: 6 weeks with the following provider: Any provider In-Person For C/S patients schedule nurse incision check in weeks 2 weeks: no High risk pregnancy complicated by: GDMA1, PreE w/o SF Delivery mode:  SVD Anticipated Birth Control:  other/unsure PP Procedures needed: 2hr GTT, BP check  Schedule Integrated BH visit: no   Newborn Data: Live born female  Birth Weight:  3140 grams APGAR: 64, 9  Newborn Delivery   Birth date/time: 05/29/2019 06:36:00 Delivery type: Vaginal, Spontaneous      Baby Feeding: Breast Disposition:home with mother   05/31/2019 Clarnce Flock, MD

## 2019-05-29 NOTE — Lactation Note (Signed)
This note was copied from a baby's chart. Lactation Consultation Note  Patient Name: Adriana Hahn [id number] Date: 05/29/2019 Reason for consult: Initial assessment;Primapara;1st time breastfeeding;Maternal endocrine disorder;Early term 37-38.6wks Type of Endocrine Disorder?: Diabetes(PCOS)  LC in to visit with P1 Mom of ET infant at 34 hrs old.  Mom GDM on Metformin during pregnancy.  Mom also has PCOS and baby was conceived by IUI.  1st 2 CBGs 45 and 29.  Baby fed Glucose Gel and 20 ml formula by bottle (Mom declined donor breast milk).  Baby has had one breastfeeding and a couple attempts along with 73ml colostrum by spoon.   Mom trying to latch baby in cradle hold, hunched over baby in her lap.  Baby swaddled in blankets.  Offered to assist with positioning and latching.    Marta the [place] Interpreter came in to assist with communication.   Mom provided with pillow support under baby.  Mom noted to have small breasts, Mom reports + breast changes with early pregnancy.  Colostrum easily expressed by hand.    Mom needing a lot of guidance to not pinch nipple and push nipple into baby's mouth.  Baby did open widely and latch, took a few sucks and then stopped.  Hand expressed drops of colostrum onto nipple, and baby opened her mouth wide and latched and sucked a few times.  We repeatedly did this until baby would open her mouth.  Placed baby STS on Mom's chest.  Demonstrated breast massage and hand expression into a spoon.  Mom would wince with hand expression.  Baby given a few drops but she was sleepy.  Baby did not start rooting around.  3rd CBG to be done at 1500.  If continues to be low, and supplementation needed, will set up DEBP and have Mom start to stimulate her milk supply.    Mom instructed to call when baby starts cueing again.    Maternal Data Formula Feeding for Exclusion: No Has patient been taught Hand Expression?: Yes Does the patient have breastfeeding experience  prior to this delivery?: No  Feeding Feeding Type: Breast Fed Nipple Type: Slow - flow  LATCH Score Latch: Repeated attempts needed to sustain latch, nipple held in mouth throughout feeding, stimulation needed to elicit sucking reflex.  Audible Swallowing: None  Type of Nipple: Everted at rest and after stimulation  Comfort (Breast/Nipple): Soft / non-tender  Hold (Positioning): Full assist, staff holds infant at breast  LATCH Score: 5  Interventions Interventions: Breast feeding basics reviewed;Assisted with latch;Skin to skin;Breast massage;Hand express;Adjust position;Support pillows;Position options  Lactation Tools Discussed/Used     Consult Status Consult Status: Follow-up Date: 05/30/19 Follow-up type: In-patient    Judee Clara 05/29/2019, 3:00 PM

## 2019-05-29 NOTE — Lactation Note (Signed)
This note was copied from a baby's chart. Lactation Consultation Note  Patient Name: Adriana Hahn [id number] Date: 05/29/2019 Reason for consult: Follow-up assessment;Difficult latch;1st time breastfeeding;Primapara;Early term 37-38.6wks;Maternal endocrine disorder Type of Endocrine Disorder?: Diabetes  3rd CBG done a little late at 1640 was 33.    Baby assisted to latch as she was showing subtle cues, and it had been 3 hrs since she had fed for 10 mins.    Baby repeatedly slipped to the nipple.  Tried in football hold and cross cradle hold.  Mom needing a lot of guidance.  Using ipad interpreter, initiated a 20 mm nipple shield with instruction on how to apply and clean after each use.   Baby able to attain a deeper latch, but still latched onto base of nipple.  After feeding for 15 mins, with deep jaw extensions noted, Mom's nipple was pulled well into shield, and some colostrum noted.  Baby had a stool after feeding.   Set up DEBP with instructions on when to pump, disassembling pump parts, washing, rinsing and air drying in separate bin. Assisted Mom to double pump on initiation setting.  24 mm flanges a good fit currently.    Plan- 1- Keep baby STS as much as possible 2- Offer breast with cues, use nipple shield prn for a deeper latch 3- hand express and do breast massage often to collect colostrum to feed to baby. 4- Pump both breasts 15 mins on initiation setting 5- save any EBM to feed to baby by spoon or instill into nipple shield. 6- ask for help prn.   Feeding Feeding Type: Breast Fed  LATCH Score Latch: Repeated attempts needed to sustain latch, nipple held in mouth throughout feeding, stimulation needed to elicit sucking reflex.  Audible Swallowing: A few with stimulation  Type of Nipple: Everted at rest and after stimulation  Comfort (Breast/Nipple): Soft / non-tender  Hold (Positioning): Assistance needed to correctly position infant at breast and maintain  latch.  LATCH Score: 7  Interventions Interventions: Breast feeding basics reviewed;Assisted with latch;Skin to skin;Breast massage;Hand express;Adjust position;Breast compression;Support pillows;Position options;Expressed milk;DEBP;Hand pump  Lactation Tools Discussed/Used Tools: Pump Breast pump type: Double-Electric Breast Pump;Manual WIC Program: No Pump Review: Setup, frequency, and cleaning;Milk Storage Initiated by:: Erby Pian RN IBCLC Date initiated:: 05/29/19   Consult Status Consult Status: Follow-up Date: 05/30/19 Follow-up type: In-patient    Judee Clara 05/29/2019, 7:12 PM

## 2019-05-30 ENCOUNTER — Ambulatory Visit (HOSPITAL_COMMUNITY): Payer: Self-pay

## 2019-05-30 ENCOUNTER — Encounter: Payer: Self-pay | Admitting: Obstetrics & Gynecology

## 2019-05-30 ENCOUNTER — Other Ambulatory Visit: Payer: Self-pay

## 2019-05-30 DIAGNOSIS — O24420 Gestational diabetes mellitus in childbirth, diet controlled: Secondary | ICD-10-CM

## 2019-05-30 LAB — CBC
HCT: 24.1 % — ABNORMAL LOW (ref 36.0–46.0)
Hemoglobin: 7.6 g/dL — ABNORMAL LOW (ref 12.0–15.0)
MCH: 27.7 pg (ref 26.0–34.0)
MCHC: 31.5 g/dL (ref 30.0–36.0)
MCV: 88 fL (ref 80.0–100.0)
Platelets: 131 10*3/uL — ABNORMAL LOW (ref 150–400)
RBC: 2.74 MIL/uL — ABNORMAL LOW (ref 3.87–5.11)
RDW: 16.3 % — ABNORMAL HIGH (ref 11.5–15.5)
WBC: 14.6 10*3/uL — ABNORMAL HIGH (ref 4.0–10.5)
nRBC: 0.1 % (ref 0.0–0.2)

## 2019-05-30 MED ORDER — SODIUM CHLORIDE 0.9 % IV SOLN
510.0000 mg | Freq: Once | INTRAVENOUS | Status: AC
  Administered 2019-05-30: 510 mg via INTRAVENOUS
  Filled 2019-05-30: qty 17

## 2019-05-30 NOTE — Anesthesia Postprocedure Evaluation (Signed)
Anesthesia Post Note  Patient: Adriana Hahn  Procedure(s) Performed: AN AD HOC LABOR EPIDURAL     Patient location during evaluation: Mother Baby Anesthesia Type: Epidural Level of consciousness: awake Pain management: satisfactory to patient Vital Signs Assessment: post-procedure vital signs reviewed and stable Respiratory status: spontaneous breathing Cardiovascular status: stable Anesthetic complications: no    Last Vitals:  Vitals:   05/29/19 2050 05/30/19 0528  BP: (!) 116/91 (!) 125/92  Pulse: 83 66  Resp: 18 18  Temp: 37.1 C 36.6 C  SpO2: 100% 97%    Last Pain:  Vitals:   05/30/19 0740  TempSrc:   PainSc: 2    Pain Goal:                Epidural/Spinal Function Cutaneous sensation: Normal sensation (05/30/19 0740), Patient able to flex knees: Yes (05/30/19 0740), Patient able to lift hips off bed: Yes (05/30/19 0740), Back pain beyond tenderness at insertion site: No (05/30/19 0740), Progressively worsening motor and/or sensory loss: No (05/30/19 0740), Bowel and/or bladder incontinence post epidural: No (05/30/19 0740)  Cephus Shelling

## 2019-05-30 NOTE — Progress Notes (Addendum)
POSTPARTUM PROGRESS NOTE  Subjective: Adriana Hahn is a 29 y.o. G1P1001 s/p SVD at [redacted]w[redacted]d.  She reports she doing well. No acute events overnight. She denies any problems with ambulating, voiding or po intake. Denies nausea or vomiting. Pain is well controlled.  Lochia is slightly heavier than menses.  Ipad interpretor used during entire patient interaction.  Objective: Blood pressure (!) 125/92, pulse 66, temperature 97.8 F (36.6 C), temperature source Oral, resp. rate 18, height 5\' 1"  (1.549 m), weight 72 kg, last menstrual period 09/02/2018, SpO2 97 %, unknown if currently breastfeeding.  Physical Exam:  General: alert, cooperative and no distress Chest: no respiratory distress Abdomen: soft, non-tender  Uterine Fundus: firm, appropriately tender Extremities: No calf swelling or tenderness  Recent Labs    05/29/19 0803 05/30/19 0617  HGB 10.2* 7.6*  HCT 31.7* 24.1*    Assessment/Plan: Adriana Hahn is a 29 y.o. G1P1001 s/p SVD at [redacted]w[redacted]d for pre-eclampsia. Patient's bleeding has improved and hemoglobin this morning of 7.6.  Routine Postpartum Care: Doing well, pain well-controlled.  -- Continue routine care, lactation support  -- Contraception: None -- Feeding: Breast -- Continue to monitor bleeding throughout day.  --Will do one dose feraheme   Dispo: Plan for discharge late today vs tomorrow.  [redacted]w[redacted]d, DO Resident, [facility]

## 2019-05-31 ENCOUNTER — Other Ambulatory Visit (HOSPITAL_COMMUNITY): Admission: RE | Admit: 2019-05-31 | Payer: Self-pay | Source: Ambulatory Visit

## 2019-05-31 MED ORDER — IBUPROFEN 600 MG PO TABS: 600.0000 mg | ORAL_TABLET | Freq: Four times a day (QID) | ORAL | 0 refills | Status: AC

## 2019-05-31 MED ORDER — POLYETHYLENE GLYCOL 3350 17 GM/SCOOP PO POWD: 1.0000 | Freq: Once | ORAL | 0 refills | Status: AC

## 2019-05-31 MED ORDER — ACETAMINOPHEN 325 MG PO TABS: 650.0000 mg | ORAL_TABLET | ORAL | 0 refills | Status: DC | PRN

## 2019-05-31 NOTE — Lactation Note (Signed)
This note was copied from a baby's chart. Lactation Consultation Note  Patient Name: Adriana Hahn [id number] Date: 05/31/2019   [facility] student entered room with Spanish interpreter Adriana Hahn for a follow up consultation. Parents were awake and alert and baby was asleep in the bassinet. [facility] student enquired about feeding and parent stated that she does not have enough milk, and is giving baby formula supplementation after every feed at the breast. [facility] student educated parent that baby does not need a lot of food at the moment, as her tummy is very small, and encouraged parent to continue to put baby to the breast at every feed before supplementation. Parent is no longer using the nipple shield for feeding at the breast, and stated that the baby is drinking very fast from the bottle. [facility] student explained pace bottle feeding, and using positioning and slow flow nipples to ensure that baby still has to work for food with the bottle. [facility] student enquired about breastfeeding goals and parent stated that her goal is to primarily breastfeed baby. [facility] student educated that with PCOS the parent may not have an abundant milk supply and that to maximize milk production she should use a breast pump when baby is getting formula supplementation to increase stimulation. [facility] student ensured that parents were familiar with taking the pump pieces apart into 5 parts for cleaning and drying, and demonstrated how to use the double hand pump. LC collected extra membranes for the pumps into one place for parents. Parents enquired as to which formula they should purchase for supplementing baby, and LC stated that what they are currently using is good, but to ask the pediatrician if they have a recommendation before discharge. LC also educated parents that they can store formula that has not been in contact with baby in the fridge for 24 to 48 hours. Parents will be educated on formula mixing by nurse.   Current plan is to put baby to  the breast at every feed (baby is feeding 15 mins per side), and then to supplement with formula as needed. Parent encouraged to pump as baby is receiving formula for extra breast stimulation.   LC gave parents the name of an outpatient lactation consultant for future follow up if they need any assistance after going home.         Adriana Hahn 05/31/2019, 9:43 AM

## 2019-05-31 NOTE — Discharge Instructions (Signed)
Parto vaginal, cuidados de puerperio Postpartum Care After Vaginal Delivery Lea esta informacin sobre cmo cuidarse desde el momento en que nazca su beb y hasta 6 a 12 semanas despus del parto (perodo del posparto). El mdico tambin podr darle instrucciones ms especficas. Comunquese con su mdico si tiene problemas o preguntas. Siga estas indicaciones en su casa: Hemorragia vaginal  Es normal tener un poco de hemorragia vaginal (loquios) despus del parto. Use un apsito sanitario para el sangrado vaginal y secrecin. ? Durante la primera semana despus del parto, la cantidad y el aspecto de los loquios a menudo es similar a las del perodo menstrual. ? Durante las siguientes semanas disminuir gradualmente hasta convertirse en una secrecin seca amarronada o amarillenta. ? En la mayora de las mujeres, los loquios se detienen completamente entre 4 a 6semanas despus del parto. Los sangrados vaginales pueden variar de mujer a mujer.  Cambie los apsitos sanitarios con frecuencia. Observe si hay cambios en el flujo, como: ? Un aumento repentino en el volumen. ? Cambio en el color. ? Cogulos sanguneos grandes.  Si expulsa un cogulo de sangre por la vagina, gurdelo y llame al mdico para informrselo. No deseche los cogulos de sangre por el inodoro antes de hablar con su mdico.  No use tampones ni se haga duchas vaginales hasta que el mdico la autorice.  Si no est amamantando, volver a tener su perodo entre 6 y 8 semanas despus del parto. Si solamente alimenta al beb con leche materna (lactancia materna exclusiva), podra no volver a tener su perodo hasta que deje de amamantar. Cuidados perineales  Mantenga la zona entre la vagina y el ano (perineo) limpia y seca, como se lo haya indicado el mdico. Utilice apsitos o aerosoles analgsicos y cremas, como se lo hayan indicado.  Si le hicieron un corte en el perineo (episiotoma) o tuvo un desgarro en la vagina, controle la  zona para detectar signos de infeccin hasta que sane. Est atenta a los siguientes signos: ? Aumento del enrojecimiento, la hinchazn o el dolor. ? Presenta lquido o sangre que supura del corte o desgarro. ? Calor. ? Pus o mal olor.  Es posible que le den una botella rociadora para que use en lugar de limpiarse el rea con papel higinico despus de usar el bao. Cuando comience a sanar, podr usar la botella rociadora antes de secarse. Asegrese de secarse suavemente.  Para aliviar el dolor causado por una episiotoma, un desgarro en la vagina o venas hinchadas en el ano (hemorroides), trate de tomar un bao de asiento tibio 2 o 3 veces por da. Un bao de asiento es un bao de agua tibia que se toma mientras se est sentado. El agua solo debe llegar hasta las caderas y cubrir las nalgas. Cuidado de las mamas  En los primeros das despus del parto, las mamas pueden sentirse pesadas, llenas e incmodas (congestin mamaria). Tambin puede escaparse leche de sus senos. El mdico puede sugerirle mtodos para aliviar este malestar. La congestin mamaria debera desaparecer al cabo de unos das.  Si est amamantando: ? Use un sostn que sujete y ajuste bien sus pechos. ? Mantenga los pezones secos y limpios. Aplquese cremas y ungentos, como se lo haya indicado el mdico. ? Es posible que deba usar discos de algodn en el sostn para absorber la leche que se filtre de sus senos. ? Puede tener contracciones uterinas cada vez que amamante durante varias semanas despus del parto. Las contracciones uterinas ayudan al tero a   regresar a su tamao habitual. ? Si tiene algn problema con la lactancia materna, colabore con el mdico o un asesor en lactancia.  Si no est amamantando: ? Evite tocarse mucho las mamas. Al hacerlo, podran producir ms leche. ? Use un sostn que le proporcione el ajuste correcto y compresas fras para reducir la hinchazn. ? No extraiga (saque) leche materna. Esto har que  produzca ms leche. Intimidad y sexualidad  Pregntele al mdico cundo puede retomar la actividad sexual. Esto puede depender de lo siguiente: ? Su riesgo de sufrir infecciones. ? La rapidez con la que est sanando. ? Su comodidad y deseo de retomar la actividad sexual.  Despus del parto, puede quedar embarazada incluso si no ha tenido todava su perodo. Si lo desea, hable con el mdico acerca de los mtodos de control de la natalidad (mtodos anticonceptivos). Medicamentos  Tome los medicamentos de venta libre y los recetados solamente como se lo haya indicado el mdico.  Si le recetaron un antibitico, tmelo como se lo haya indicado el mdico. No deje de tomar el antibitico aunque comience a sentirse mejor. Actividad  Retome sus actividades normales de a poco como se lo haya indicado el mdico. Pregntele al mdico qu actividades son seguras para usted.  Descanse todo lo que pueda. Trate de descansar o tomar una siesta mientras el beb duerme. Comida y bebida   Beba suficiente lquido como para mantener la orina de color amarillo plido.  Coma alimentos ricos en fibras todos los das. Estos pueden ayudarla a prevenir o aliviar el estreimiento. Los alimentos ricos en fibras incluyen, entre otros: ? Panes y cereales integrales. ? Arroz integral. ? Frijoles. ? Frutas y verduras frescas.  No intente perder de peso rpidamente reduciendo el consumo de caloras.  Tome sus vitaminas prenatales hasta la visita de seguimiento de posparto o hasta que su mdico le indique que puede dejar de tomarlas. Estilo de vida  No consuma ningn producto que contenga nicotina o tabaco, como cigarrillos y cigarrillos electrnicos. Si necesita ayuda para dejar de fumar, consulte al mdico.  No beba alcohol, especialmente si est amamantando. Instrucciones generales  Concurra a todas las visitas de seguimiento para usted y el beb, como se lo haya indicado el mdico. La mayora de las mujeres  visita al mdico para un seguimiento de posparto dentro de las primeras 3 a 6 semanas despus del parto. Comunquese con un mdico si:  Se siente incapaz de controlar los cambios que implica tener un hijo y esos sentimientos no desaparecen.  Siente tristeza o preocupacin de forma inusual.  Las mamas se ponen rojas, le duelen o se endurecen.  Tiene fiebre.  Tiene dificultad para retener la orina o para impedir que la orina se escape.  Tiene poco inters o falta de inters en actividades que solan gustarle.  No ha amamantado nada y no ha tenido un perodo menstrual durante 12 semanas despus del parto.  Dej de amamantar al beb y no ha tenido su perodo menstrual durante 12 semanas despus de dejar de amamantar.  Tiene preguntas sobre su cuidado y el del beb.  Elimina un cogulo de sangre grande por la vagina. Solicite ayuda de inmediato si:  Siente dolor en el pecho.  Tiene dificultad para respirar.  Tiene un dolor repentino e intenso en la pierna.  Tiene dolor intenso o clicos en el la parte inferior del abdomen.  Tiene una hemorragia tan intensa de la vagina que empapa ms de un apsito en una hora. El sangrado   no debe ser ms abundante que el perodo ms intenso que haya tenido.  Dolor de cabeza intenso.  Se desmaya.  Tiene visin borrosa o Geologist, engineering.  Tiene secrecin vaginal con mal olor.  Tiene pensamientos acerca de lastimarse a usted misma o a su beb. Si alguna vez siente que puede lastimarse a usted misma o a Producer, television/film/video, o tiene pensamientos de poner fin a su vida, busque ayuda de inmediato. Puede dirigirse al departamento de emergencias ms cercano o llamar a:  El servicio de emergencias de su localidad (911 en [place]  Una lnea de asistencia al suicida y Freight forwarder en crisis, como la [employer] de Prevencin del Suicidio (National Suicide Prevention Lifeline), al [phone]. Est disponible las 24 horas del da. Resumen  El  perodo de tiempo justo despus el parto y [address] a 12 semanas despus del parto se denomina perodo posparto.  Retome sus actividades normales de a Theme park manager se lo haya indicado el mdico.  Concurra a todas las visitas de seguimiento para usted y Photographer beb, como se lo haya indicado el mdico. Esta informacin no tiene Marine scientist el consejo del mdico. Asegrese de hacerle al mdico cualquier pregunta que tenga. Document Revised: 07/02/2017 Document Reviewed: 03/13/2017 Elsevier Patient Education  [place].   Hipertensin posparto Postpartum Hypertension La hipertensin posparto es el aumento de la presin arterial que se mantiene ms alta de lo normal despus del [place]. Puede no darse cuenta de que tiene hipertensin posparto si no le miden la presin arterial con regularidad. En la [employer], la hipertensin posparto desaparece sola, por lo general en la semana posterior al parto. Sin embargo, algunas mujeres requieren tratamiento mdico para prevenir complicaciones graves, como convulsiones o un accidente cerebrovascular. Cules son las causas? Esta afeccin puede ser causada por uno o ms de lo siguiente:  Hipertensin que exista antes del embarazo (hipertensin crnica).  Hipertensin que surge como resultado del embarazo (hipertensin gestacional).  Trastornos hipertensivos Solicitor (preeclampsia) o convulsiones en las mujeres que tienen hipertensin arterial durante el embarazo (eclampsia).  Una afeccin en la que el hgado, las plaquetas y los glbulos rojos se daan durante el embarazo (sndrome de HELLP).  Una afeccin en la cual la glndula tiroidea produce demasiadas hormonas (hipertiroidismo).  Otros problemas poco frecuentes de los nervios (trastornos neurolgicos) o trastornos de Herbalist. En algunos casos, es posible que la causa se desconozca. Qu incrementa el riesgo? Los siguientes factores pueden hacer que usted sea ms  propensa a tener esta afeccin:  Hipertensin crnica. En algunos casos, es posible que esta no se haya diagnosticado antes del embarazo.  Obesidad.  Diabetes tipo 2.  Enfermedad renal.  Antecedentes de preeclampsia o eclampsia.  Otras afecciones mdicas que modifican el nivel de hormonas en el cuerpo (desequilibrio hormonal). Cules son los signos o los sntomas? Al igual que con todos los tipos de hipertensin, la hipertensin posparto puede no causar ningn sntoma. Segn lo alta que est la presin arterial, puede presentar lo siguiente:  Dolores de [place]. Estos pueden ser leves, moderados o intensos. Tambin pueden ser regulares, constantes o de inicio repentino (cefalea en estallido).  Cambios en la capacidad para ver (cambios visuales).  Mareos.  Falta de aire.  Hinchazn de [employer], los pies, la parte inferior de las piernas o el rostro. En algunos casos, puede tener hinchazn en varias de estas reas.  Palpitaciones o latidos cardacos acelerados.  Dificultad para respirar al Cleora Fleet.  Disminucin en la cantidad de orina que elimina. Otros signos y sntomas poco frecuentes pueden incluir lo siguiente:  Ms sudoracin que la habitual. Esta dura algo ms que [employer] del parto.  Dolor en el pecho.  Mareos repentinos al levantarse despus de haber estado sentada o [place].  Convulsiones.  Nuseas o vmitos.  Dolor abdominal. Cmo se diagnostica? Esta afeccin se puede diagnosticar en funcin de los resultados de un examen fsico, mediciones de la presin arteria, y [place] de [place] y [place]. Tambin puede someterse a otras pruebas, como una exploracin por tomografa computarizada (TC) o una resonancia magntica (RM) para Engineer, manufacturing otros problemas de la hipertensin posparto. Cmo se trata? Si la presin arterial est lo suficientemente alta como para requerir tratamiento, las opciones pueden incluir lo siguiente:  Medicamentos para  disminuir la presin arterial (antihipertensivos). Dgale al mdico si est amamantando o si planifica hacerlo. Hay muchos medicamentos antihipertensivos que pueden tomarse sin riesgos durante la Market researcher.  Interrupcin de los [employer] puedan causar la hipertensin.  Tratamiento de las enfermedades que causan la hipertensin.  Tratamiento de las complicaciones de la hipertensin, como convulsiones, accidente cerebrovascular o problemas renales. El mdico seguir controlando atentamente la presin arterial hasta que esta se encuentre en un nivel seguro para usted. Siga estas indicaciones en su casa:  Tome los medicamentos de venta libre y los recetados solamente como se lo haya indicado el mdico.  Retome sus actividades normales como se lo haya indicado el mdico. Pregntele al mdico qu actividades son seguras para usted.  No consuma ningn producto que contenga nicotina o tabaco, como cigarrillos y Administrator, Civil Service. Si necesita ayuda para dejar de fumar, consulte al mdico.  Concurra a todas las visitas de control como se lo haya indicado el mdico. Esto es importante. Comunquese con un mdico si:  Los sntomas empeoran.  Aparecen nuevos sntomas, por ejemplo: ? Un dolor de cabeza que no mejora. ? Mareos. ? Cambios en la visin. Solicite ayuda de inmediato si:  Presenta hinchazn repentina [employer], los tobillos o el rostro.  Tiene un aumento de peso repentino y rpido.  Tiene dificultad para respirar, dolor en el pecho, latidos cardacos acelerados o palpitaciones cardacas.  Siente dolor intenso en el abdomen.  Tiene sntomas de un accidente cerebrovascular. "BE FAST" es una manera fcil de recordar las principales seales de advertencia de un accidente cerebrovascular: ? B - Balance (equilibrio). Los signos son dificultad repentina para caminar o prdida del equilibrio. ? E - Eyes (ojos). Los signos son dificultad para ver o un cambio repentino en la  visin. ? F - Face (rostro). Los signos son debilidad repentina o entumecimiento del rostro, o el rostro o el prpado que se caen hacia un lado. ? A - Arms (brazos). Los signos son debilidad o entumecimiento en un brazo. Esto sucede de repente y generalmente en un lado del cuerpo. ? S - Speech (habla). Los signos son dificultad para hablar, hablar arrastrando las palabras o dificultad para comprender lo que la gente dice. ? T - Time (tiempo). Es tiempo de Freight forwarder a los servicios de Sports administrator. Escriba la hora en la que comenzaron los sntomas.  Presenta otros signos de accidente cerebrovascular, como los siguientes: ? Dolor de cabeza sbito e intenso que no tiene causa aparente. ? Nuseas o vmitos. ? Convulsiones. Estos sntomas pueden representar un problema grave que constituye Radio broadcast assistant. No espere hasta que los sntomas desaparezcan. Solicite atencin mdica de inmediato. Comunquese con el servicio de emergencias de  su localidad (911 en los [place]). No conduzca por sus propios medios [employer] hospital. Resumen  La hipertensin posparto es el aumento de la presin arterial que se mantiene ms alta de lo normal despus del [place].  En la [employer], la hipertensin posparto desaparece sola, por lo general en la semana posterior al parto.  Algunas mujeres requieren tratamiento mdico para prevenir complicaciones graves, como convulsiones o un accidente cerebrovascular. Esta informacin no tiene Theme park manager el consejo del mdico. Asegrese de hacerle al mdico cualquier pregunta que tenga. Document Revised: 07/02/2017 Document Reviewed: 03/13/2017 Elsevier Patient Education  2020 [employer].

## 2019-05-31 NOTE — Lactation Note (Addendum)
This note was copied from a baby's chart. Lactation Consultation Note  Patient Name: [name] [id number] Date: 05/31/2019  I concur with the student's note. Enfamil extra-slow flow nipples were provided to parents since Mom commented about infant drinking quickly.   Mom wants to think about whether or not she desires a f/u lactation appt, but was provided the name of the [facility] where "[place]" will receive pediatric care.    Mom received IV Feraheme while inpatient for a Hgb of 7.6 (which was a decrease from a Hgb of 10.2 at admission).  Lurline Hare [facility] 05/31/2019, 9:03 AM

## 2019-05-31 NOTE — Lactation Note (Signed)
This note was copied from a baby's chart. Lactation Consultation Note  Patient Name: Adriana Hahn [id number] Date: 05/31/2019   Message sent to Soyla Dryer at [facility] asking her to see Mom tomorrow.   Judee Clara 05/31/2019, 11:28 AM

## 2019-06-02 ENCOUNTER — Inpatient Hospital Stay (HOSPITAL_COMMUNITY): Admission: AD | Admit: 2019-06-02 | Payer: Self-pay | Source: Home / Self Care | Admitting: Obstetrics & Gynecology

## 2019-06-02 ENCOUNTER — Inpatient Hospital Stay (HOSPITAL_COMMUNITY): Payer: Self-pay

## 2019-06-07 ENCOUNTER — Ambulatory Visit (INDEPENDENT_AMBULATORY_CARE_PROVIDER_SITE_OTHER): Payer: Self-pay | Admitting: *Deleted

## 2019-06-07 ENCOUNTER — Other Ambulatory Visit: Payer: Self-pay

## 2019-06-07 ENCOUNTER — Encounter (HOSPITAL_COMMUNITY): Payer: Self-pay | Admitting: Obstetrics & Gynecology

## 2019-06-07 ENCOUNTER — Inpatient Hospital Stay (HOSPITAL_COMMUNITY)
Admission: AD | Admit: 2019-06-07 | Discharge: 2019-06-07 | Disposition: A | Discharge status: Home / Self Care | Payer: Medicaid Other | Attending: Obstetrics & Gynecology | Admitting: Obstetrics & Gynecology

## 2019-06-07 VITALS — BP 126/83 | HR 79 | Wt 140.8 lb

## 2019-06-07 DIAGNOSIS — Z0130 Encounter for examination of blood pressure without abnormal findings: Secondary | ICD-10-CM

## 2019-06-07 DIAGNOSIS — O165 Unspecified maternal hypertension, complicating the puerperium: Secondary | ICD-10-CM | POA: Insufficient documentation

## 2019-06-07 DIAGNOSIS — G43009 Migraine without aura, not intractable, without status migrainosus: Secondary | ICD-10-CM | POA: Insufficient documentation

## 2019-06-07 DIAGNOSIS — R030 Elevated blood-pressure reading, without diagnosis of hypertension: Secondary | ICD-10-CM | POA: Diagnosis present

## 2019-06-07 DIAGNOSIS — Z8759 Personal history of other complications of pregnancy, childbirth and the puerperium: Secondary | ICD-10-CM

## 2019-06-07 DIAGNOSIS — O99893 Other specified diseases and conditions complicating puerperium: Secondary | ICD-10-CM | POA: Insufficient documentation

## 2019-06-07 LAB — CBC
HCT: 30.8 % — ABNORMAL LOW (ref 36.0–46.0)
Hemoglobin: 9.9 g/dL — ABNORMAL LOW (ref 12.0–15.0)
MCH: 28.8 pg (ref 26.0–34.0)
MCHC: 32.1 g/dL (ref 30.0–36.0)
MCV: 89.5 fL (ref 80.0–100.0)
Platelets: 391 10*3/uL (ref 150–400)
RBC: 3.44 MIL/uL — ABNORMAL LOW (ref 3.87–5.11)
RDW: 20 % — ABNORMAL HIGH (ref 11.5–15.5)
WBC: 8 10*3/uL (ref 4.0–10.5)
nRBC: 0 % (ref 0.0–0.2)

## 2019-06-07 LAB — URINALYSIS, ROUTINE W REFLEX MICROSCOPIC
Bacteria, UA: NONE SEEN
Bilirubin Urine: NEGATIVE
Glucose, UA: NEGATIVE mg/dL
Ketones, ur: NEGATIVE mg/dL
Nitrite: NEGATIVE
Protein, ur: NEGATIVE mg/dL
Specific Gravity, Urine: 1.01 (ref 1.005–1.030)
pH: 6 (ref 5.0–8.0)

## 2019-06-07 LAB — COMPREHENSIVE METABOLIC PANEL
ALT: 40 U/L (ref 0–44)
AST: 27 U/L (ref 15–41)
Albumin: 3.3 g/dL — ABNORMAL LOW (ref 3.5–5.0)
Alkaline Phosphatase: 109 U/L (ref 38–126)
Anion gap: 9 (ref 5–15)
BUN: 14 mg/dL (ref 6–20)
CO2: 23 mmol/L (ref 22–32)
Calcium: 9.1 mg/dL (ref 8.9–10.3)
Chloride: 107 mmol/L (ref 98–111)
Creatinine, Ser: 0.64 mg/dL (ref 0.44–1.00)
GFR calc Af Amer: >60 mL/min (ref >60)
GFR calc non Af Amer: >60 mL/min (ref >60)
Glucose, Bld: 86 mg/dL (ref 70–99)
Potassium: 4 mmol/L (ref 3.5–5.1)
Sodium: 139 mmol/L (ref 135–145)
Total Bilirubin: 0.3 mg/dL (ref 0.3–1.2)
Total Protein: 6 g/dL — ABNORMAL LOW (ref 6.5–8.1)

## 2019-06-07 LAB — PROTEIN / CREATININE RATIO, URINE
Creatinine, Urine: 49.31 mg/dL
Protein Creatinine Ratio: 0.22 mg/mg{Cre} — ABNORMAL HIGH (ref 0.00–0.15)
Total Protein, Urine: 11 mg/dL

## 2019-06-07 MED ORDER — BUTALBITAL-APAP-CAFFEINE 50-325-40 MG PO TABS
2.0000 | ORAL_TABLET | Freq: Once | ORAL | Status: AC
  Administered 2019-06-07: 2 via ORAL
  Filled 2019-06-07: qty 2

## 2019-06-07 MED ORDER — BUTALBITAL-APAP-CAFFEINE 50-325-40 MG PO TABS: 1.0000 | ORAL_TABLET | Freq: Four times a day (QID) | ORAL | 0 refills | Status: DC | PRN

## 2019-06-07 NOTE — MAU Provider Note (Signed)
Chief Complaint: Hypertension and Headache   First Provider Initiated Contact with Patient 06/07/19 1724      SUBJECTIVE HPI: Adriana Hahn is a 29 y.o. G1P1001 on day 12 following NSVD with preeclampsia during hospital admission who presents to maternity admissions sent from the office for elevated BP and headache.  She reports headache that is frontal, constant, stronger on the right side, associated with photophobia, and not improved with ibuprofen. She has hx of migraine and reports this is similar to her migraines she had before pregnancy. There are no other symptoms, she has not tried any treatments.   HPI  Past Medical History:  Diagnosis Date  . Gestational diabetes   . Migraines   . PCOS (polycystic ovarian syndrome)    Past Surgical History:  Procedure Laterality Date  . NO PAST SURGERIES     Social History   Socioeconomic History  . Marital status: Married    Spouse name: Stephens November  . Number of children: 0  . Years of education: 46  . Highest education level: High school graduate  Occupational History  . Occupation: unemployed  Tobacco Use  . Smoking status: Never Smoker  . Smokeless tobacco: Never Used  Substance and Sexual Activity  . Alcohol use: No  . Drug use: No  . Sexual activity: Not Currently    Partners: Male    Birth control/protection: None  Other Topics Concern  . Not on file  Social History Narrative  . Not on file   Social Determinants of Health   Financial Resource Strain:   . Difficulty of Paying Living Expenses: Not on file  Food Insecurity: No Food Insecurity  . Worried About Programme researcher, broadcasting/film/video in the Last Year: Never true  . Ran Out of Food in the Last Year: Never true  Transportation Needs: No Transportation Needs  . Lack of Transportation (Medical): No  . Lack of Transportation (Non-Medical): No  Physical Activity:   . Days of Exercise per Week: Not on file  . Minutes of Exercise per Session: Not on file  Stress:   .  Feeling of Stress : Not on file  Social Connections:   . Frequency of Communication with Friends and Family: Not on file  . Frequency of Social Gatherings with Friends and Family: Not on file  . Attends Religious Services: Not on file  . Active Member of Clubs or Organizations: Not on file  . Attends Banker Meetings: Not on file  . Marital Status: Not on file  Intimate Partner Violence:   . Fear of Current or Ex-Partner: Not on file  . Emotionally Abused: Not on file  . Physically Abused: Not on file  . Sexually Abused: Not on file   No current facility-administered medications on file prior to encounter.   Current Outpatient Medications on File Prior to Encounter  Medication Sig Dispense Refill  . ibuprofen (ADVIL) 600 MG tablet Take 1 tablet (600 mg total) by mouth every 6 (six) hours. 30 tablet 0  . Prenatal Vit-Fe Fumarate-FA (PREPLUS) 27-1 MG TABS Take 1 tablet by mouth daily.    Marland Kitchen acetaminophen (TYLENOL) 325 MG tablet Take 2 tablets (650 mg total) by mouth every 4 (four) hours as needed (for pain scale < 4). (Patient not taking: Reported on 06/07/2019) 30 tablet 0   Allergies  Allergen Reactions  . Cherry Anaphylaxis  . Almond (Diagnostic) Itching  . Apple Itching    Red apples cause this more than other varieties of apples  .  Black Walnut Flavor Itching    ROS:  Review of Systems  Constitutional: Negative for chills, fatigue and fever.  Eyes: Negative for visual disturbance.  Respiratory: Negative for shortness of breath.   Cardiovascular: Negative for chest pain.  Gastrointestinal: Negative for abdominal pain, nausea and vomiting.  Genitourinary: Negative for difficulty urinating, dysuria, flank pain, pelvic pain, vaginal bleeding, vaginal discharge and vaginal pain.  Neurological: Positive for headaches. Negative for dizziness.  Psychiatric/Behavioral: Negative.      I have reviewed patient's Past Medical Hx, Surgical Hx, Family Hx, Social Hx,  medications and allergies.   Physical Exam   Patient Vitals for the past 24 hrs:  BP Temp Temp src Pulse Resp SpO2  06/07/19 2045 (!) 149/102 -- -- 73 -- --  06/07/19 1915 (!) 142/86 -- -- 63 -- --  06/07/19 1900 (!) 144/86 -- -- 62 -- --  06/07/19 1845 139/87 -- -- 63 -- --  06/07/19 1830 133/85 -- -- 65 -- --  06/07/19 1815 130/84 -- -- 66 -- --  06/07/19 1800 126/90 -- -- 64 -- --  06/07/19 1745 125/83 -- -- 69 -- --  06/07/19 1730 123/72 -- -- 69 -- --  06/07/19 1715 121/78 -- -- 71 -- --  06/07/19 1707 125/82 -- -- 72 -- --  06/07/19 1644 128/90 98.4 F (36.9 C) Oral 81 16 100 %   Constitutional: Well-developed, well-nourished female in no acute distress.  HEART: normal rate, heart sounds, regular rhythm RESP: normal effort, lung sounds clear and equal bilaterally GI: Abd soft, non-tender. Pos BS x 4 MS: Extremities nontender, no edema, normal ROM Neurologic: Alert and oriented x 4.  GU: Neg CVAT.   LAB RESULTS Results for orders placed or performed during the hospital encounter of 06/07/19 (from the past 24 hour(s))  Protein / creatinine ratio, urine     Status: Abnormal   Collection Time: 06/07/19  5:21 PM  Result Value Ref Range   Creatinine, Urine 49.31 mg/dL   Total Protein, Urine 11 mg/dL   Protein Creatinine Ratio 0.22 (H) 0.00 - 0.15 mg/mg[Cre]  Urinalysis, Routine w reflex microscopic     Status: Abnormal   Collection Time: 06/07/19  5:21 PM  Result Value Ref Range   Color, Urine STRAW (A) YELLOW   APPearance CLEAR CLEAR   Specific Gravity, Urine 1.010 1.005 - 1.030   pH 6.0 5.0 - 8.0   Glucose, UA NEGATIVE NEGATIVE mg/dL   Hgb urine dipstick SMALL (A) NEGATIVE   Bilirubin Urine NEGATIVE NEGATIVE   Ketones, ur NEGATIVE NEGATIVE mg/dL   Protein, ur NEGATIVE NEGATIVE mg/dL   Nitrite NEGATIVE NEGATIVE   Leukocytes,Ua TRACE (A) NEGATIVE   RBC / HPF 0-5 0 - 5 RBC/hpf   WBC, UA 0-5 0 - 5 WBC/hpf   Bacteria, UA NONE SEEN NONE SEEN   Squamous Epithelial /  LPF 0-5 0 - 5  CBC     Status: Abnormal   Collection Time: 06/07/19  5:46 PM  Result Value Ref Range   WBC 8.0 4.0 - 10.5 K/uL   RBC 3.44 (L) 3.87 - 5.11 MIL/uL   Hemoglobin 9.9 (L) 12.0 - 15.0 g/dL   HCT 50.0 (L) 37.0 - 48.8 %   MCV 89.5 80.0 - 100.0 fL   MCH 28.8 26.0 - 34.0 pg   MCHC 32.1 30.0 - 36.0 g/dL   RDW 89.1 (H) 69.4 - 50.3 %   Platelets 391 150 - 400 K/uL   nRBC 0.0 0.0 - 0.2 %  Comprehensive metabolic panel     Status: Abnormal   Collection Time: 06/07/19  5:46 PM  Result Value Ref Range   Sodium 139 135 - 145 mmol/L   Potassium 4.0 3.5 - 5.1 mmol/L   Chloride 107 98 - 111 mmol/L   CO2 23 22 - 32 mmol/L   Glucose, Bld 86 70 - 99 mg/dL   BUN 14 6 - 20 mg/dL   Creatinine, Ser 0.64 0.44 - 1.00 mg/dL   Calcium 9.1 8.9 - 10.3 mg/dL   Total Protein 6.0 (L) 6.5 - 8.1 g/dL   Albumin 3.3 (L) 3.5 - 5.0 g/dL   AST 27 15 - 41 U/L   ALT 40 0 - 44 U/L   Alkaline Phosphatase 109 38 - 126 U/L   Total Bilirubin 0.3 0.3 - 1.2 mg/dL   GFR calc non Af Amer >60 >60 mL/min   GFR calc Af Amer >60 >60 mL/min   Anion gap 9 5 - 15    --/--/O POS, O POS Performed at [facility], [address] [place], [place] [address]  (02/22 1302)  IMAGING   MAU Management/MDM: Orders Placed This Encounter  Procedures  . CBC  . Comprehensive metabolic panel  . Protein / creatinine ratio, urine  . Urinalysis, Routine w reflex microscopic  . Discharge patient    Meds ordered this encounter  Medications  . butalbital-acetaminophen-caffeine (FIORICET) 50-325-40 MG per tablet 2 tablet  . butalbital-acetaminophen-caffeine (FIORICET) 50-325-40 MG tablet    Sig: Take 1-2 tablets by mouth every 6 (six) hours as needed for headache.    Dispense:  20 tablet    Refill:  0    Order Specific Question:   Supervising Provider    Answer:   Verita Schneiders A [[id number]]    Pt with headache reduced from 5/10 to 1-2/10 with Fioricet given in MAU.  PEC labs wnl with P/C ratio down from 0.77 at  delivery to 0.22 today.  BP mostly wnl with some borderline slightly elevated BP of 140s/90s.  Consult Dr Hulan Fray with assessment and findings.  D/C home, pt to f/u with BP check at [facility] on Monday.  Return to MAU with worsening symptoms.  Rx for Fioricet sent to pharmacy.   ASSESSMENT 1. Postpartum hypertension   2. Migraine without aura and without status migrainosus, not intractable     PLAN Discharge home Allergies as of 06/07/2019      Reactions   Cherry Anaphylaxis   Almond (diagnostic) Itching   Apple Itching   Red apples cause this more than other varieties of apples   Black Walnut Flavor Itching      Medication List    TAKE these medications   acetaminophen 325 MG tablet Commonly known as: Tylenol Take 2 tablets (650 mg total) by mouth every 4 (four) hours as needed (for pain scale < 4).   butalbital-acetaminophen-caffeine 50-325-40 MG tablet Commonly known as: FIORICET Take 1-2 tablets by mouth every 6 (six) hours as needed for headache.   ibuprofen 600 MG tablet Commonly known as: ADVIL Take 1 tablet (600 mg total) by mouth every 6 (six) hours.   PrePLUS 27-1 MG Tabs Take 1 tablet by mouth daily.      Follow-up [place] for [facility] Follow up.   Specialty: Obstetrics and Gynecology Why: The office will call you to schedule blood pressure check next week. Return to MAU as needed for emergencies.  Contact information: [id number] [place] [address] [phone]          Sharen Counter Certified Nurse-Midwife 06/07/2019  8:50 PM

## 2019-06-07 NOTE — Progress Notes (Signed)
Here for bp check.  Status post vaginal delivery , had pre-eclampsia. C/o headache yesterday and today. Thinks she is getting migraine because she isn't getting sleep  And feels like migraines felt. States all edema gone - last was 2 days ago had a little.  Reviewed patient history of preeclampsia, pt. bp and c/o HA with Dr. Vergie Living. Instructed patient to go to [facility] now  for evaluation of preeclampsia. She voices understanding and states she will go there now.  [place]ane[place] Spees,RN

## 2019-06-07 NOTE — MAU Note (Signed)
Vag delivery 2/23, was induced because a of high BP. Sent over for clinic. Is not on BP meds.  +HA , no visual changes, denies epigastric pain or swelling.

## 2019-06-11 ENCOUNTER — Ambulatory Visit (INDEPENDENT_AMBULATORY_CARE_PROVIDER_SITE_OTHER): Payer: Self-pay

## 2019-06-11 ENCOUNTER — Other Ambulatory Visit: Payer: Self-pay

## 2019-06-11 VITALS — BP 138/84 | HR 81

## 2019-06-11 DIAGNOSIS — Z0130 Encounter for examination of blood pressure without abnormal findings: Secondary | ICD-10-CM

## 2019-06-11 NOTE — Progress Notes (Signed)
With Spanish Interpreter Raquel M., pt here today for BP check s/p elevated BP post vaginal delivery.  Pt denies any headaches or visual disturbances.  BP LA 138/84.   Pt advised that her BP looks great.  Continue to monitor for sx's of HTN and that we will f/u with her at her pp visit on 07/02/19.  Pt informed to please have no food or drink starting midnight the night before her appt for the glucose drink.  Notified Dr. Vergie Living- no new recommendations.   Pt verbalized understanding.   Addison Naegeli, RN 06/11/19

## 2019-06-11 NOTE — Progress Notes (Signed)
Patient seen and assessed by nursing staff during this encounter. I have reviewed the chart and agree with the documentation and plan.  [place] Bing, MD 06/11/2019 8:03 AM

## 2019-06-12 NOTE — Progress Notes (Signed)
Patient seen and assessed by nursing staff during this encounter. I have reviewed the chart and agree with the documentation and plan.  Dulac Bing, MD 06/12/2019 7:44 AM

## 2019-06-26 ENCOUNTER — Other Ambulatory Visit (HOSPITAL_COMMUNITY): Payer: Self-pay

## 2019-07-02 ENCOUNTER — Other Ambulatory Visit: Payer: Self-pay

## 2019-07-02 ENCOUNTER — Other Ambulatory Visit: Payer: Medicaid Other

## 2019-07-02 ENCOUNTER — Encounter: Payer: Self-pay | Admitting: Nurse Practitioner

## 2019-07-02 ENCOUNTER — Ambulatory Visit (INDEPENDENT_AMBULATORY_CARE_PROVIDER_SITE_OTHER): Payer: Medicaid Other | Admitting: Nurse Practitioner

## 2019-07-02 DIAGNOSIS — O24419 Gestational diabetes mellitus in pregnancy, unspecified control: Secondary | ICD-10-CM

## 2019-07-02 DIAGNOSIS — Z8759 Personal history of other complications of pregnancy, childbirth and the puerperium: Secondary | ICD-10-CM

## 2019-07-02 NOTE — Patient Instructions (Addendum)
Biofreeze for your back Tennis ball for your back.   Back Pain in Pregnancy Back pain during pregnancy is common. Back pain may be caused by several factors that are related to changes during your pregnancy. Follow these instructions at home: Managing pain, stiffness, and swelling      If directed, for sudden (acute) back pain, put ice on the painful area. ? Put ice in a plastic bag. ? Place a towel between your skin and the bag. ? Leave the ice on for 20 minutes, 2-3 times per day.  If directed, apply heat to the affected area before you exercise. Use the heat source that your health care provider recommends, such as a moist heat pack or a heating pad. ? Place a towel between your skin and the heat source. ? Leave the heat on for 20-30 minutes. ? Remove the heat if your skin turns bright red. This is especially important if you are unable to feel pain, heat, or cold. You may have a greater risk of getting burned.  If directed, massage the affected area. Activity  Exercise as told by your health care provider. Gentle exercise is the best way to prevent or manage back pain.  Listen to your body when lifting. If lifting hurts, ask for help or bend your knees. This uses your leg muscles instead of your back muscles.  Squat down when picking up something from the floor. Do not bend over.  Only use bed rest for short periods as told by your health care provider. Bed rest should only be used for the most severe episodes of back pain. Standing, sitting, and lying down  Do not stand in one place for long periods of time.  Use good posture when sitting. Make sure your head rests over your shoulders and is not hanging forward. Use a pillow on your lower back if necessary.  Try sleeping on your side, preferably the left side, with a pregnancy support pillow or 1-2 regular pillows between your legs. ? If you have back pain after a night's rest, your bed may be too soft. ? A firm mattress  may provide more support for your back during pregnancy. General instructions  Do not wear high heels.  Eat a healthy diet. Try to gain weight within your health care provider's recommendations.  Use a maternity girdle, elastic sling, or back brace as told by your health care provider.  Take over-the-counter and prescription medicines only as told by your health care provider.  Work with a physical therapist or massage therapist to find ways to manage back pain. Acupuncture or massage therapy may be helpful.  Keep all follow-up visits as told by your health care provider. This is important. Contact a health care provider if:  Your back pain interferes with your daily activities.  You have increasing pain in other parts of your body. Get help right away if:  You develop numbness, tingling, weakness, or problems with the use of your arms or legs.  You develop severe back pain that is not controlled with medicine.  You have a change in bowel or bladder control.  You develop shortness of breath, dizziness, or you faint.  You develop nausea, vomiting, or sweating.  You have back pain that is a rhythmic, cramping pain similar to labor pains. Labor pain is usually 1-2 minutes apart, lasts for about 1 minute, and involves a bearing down feeling or pressure in your pelvis.  You have back pain and your water breaks or  you have vaginal bleeding.  You have back pain or numbness that travels down your leg.  Your back pain developed after you fell.  You develop pain on one side of your back.  You see blood in your urine.  You develop skin blisters in the area of your back pain. Summary  Back pain may be caused by several factors that are related to changes during your pregnancy.  Follow instructions as told by your health care provider for managing pain, stiffness, and swelling.  Exercise as told by your health care provider. Gentle exercise is the best way to prevent or manage  back pain.  Take over-the-counter and prescription medicines only as told by your health care provider.  Keep all follow-up visits as told by your health care provider. This is important. This information is not intended to replace advice given to you by your health care provider. Make sure you discuss any questions you have with your health care provider. Document Revised: 07/11/2018 Document Reviewed: 09/07/2017 Elsevier Patient Education  2020 [employer].

## 2019-07-02 NOTE — Progress Notes (Signed)
Pt just wants to use Condoms for Birth Control.

## 2019-07-02 NOTE — Progress Notes (Signed)
  Subjective:     Adriana Hahn is a 29 y.o. female who presents for a postpartum visit. She is 4 weeks postpartum following a spontaneous vaginal delivery. I have fully reviewed the prenatal and intrapartum course. The delivery was at 38/3 gestational weeks. Outcome: spontaneous vaginal delivery. Anesthesia: epidural. Postpartum course has been good. Baby's course has been good. Baby is feeding by both breast and bottle - Carnation Good Start. Bleeding no bleeding. Bowel function is normal. Bladder function is normal. Patient is not sexually active. Contraception method is none. Postpartum depression screening: negative.  The following portions of the patient's history were reviewed and updated as appropriate: allergies, current medications, past family history, past medical history, past social history, past surgical history and problem list.  Review of Systems Pertinent items noted in HPI and remainder of comprehensive ROS otherwise negative.   Objective:    BP 127/84   Pulse 85   Ht 5\' 1"  (1.549 m)   Wt 138 lb 9.6 oz (62.9 kg)   BMI 26.19 kg/m   General:  alert, cooperative and no distress   Breasts:  deferred - breastfeeding  Lungs: clear to auscultation bilaterally  Heart:  regular rate and rhythm, S1, S2 normal, no murmur, click, rub or gallop  Abdomen: not examined   Vulva:  deferred  Vagina:   Cervix:    Corpus:   Adnexa:    Rectal Exam:         Assessment:    Normal postpartum exam. Pap smear not done at today's visit.   Plan:    1. Contraception: condoms 2. 2 hr done today for history of gestational diabetes.  Normal BP. 3. Follow up in: 6 months for pap smear or as needed.    , RN, MSN, NP-BC Nurse Practitioner, [facility] for [facility] 07/02/2019 5:10 PM

## 2019-07-04 LAB — GLUCOSE TOLERANCE, 2 HOURS
Glucose, 2 hour: 99 mg/dL (ref 65–139)
Glucose, GTT - Fasting: 85 mg/dL (ref 65–99)

## 2020-01-08 ENCOUNTER — Encounter: Payer: Self-pay | Admitting: Obstetrics & Gynecology

## 2020-01-08 ENCOUNTER — Ambulatory Visit (INDEPENDENT_AMBULATORY_CARE_PROVIDER_SITE_OTHER): Payer: Medicaid Other | Admitting: Obstetrics & Gynecology

## 2020-01-08 ENCOUNTER — Other Ambulatory Visit: Payer: Self-pay

## 2020-01-08 VITALS — BP 120/82 | Ht 61.0 in | Wt 152.0 lb

## 2020-01-08 DIAGNOSIS — Z789 Other specified health status: Secondary | ICD-10-CM

## 2020-01-08 DIAGNOSIS — Z01419 Encounter for gynecological examination (general) (routine) without abnormal findings: Secondary | ICD-10-CM

## 2020-01-08 DIAGNOSIS — R87610 Atypical squamous cells of undetermined significance on cytologic smear of cervix (ASC-US): Secondary | ICD-10-CM

## 2020-01-08 NOTE — Progress Notes (Signed)
Adriana Hahn September 17, 1990 [id number]   History:    29 y.o. 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  Breastfeeding.  Using condoms.  C/O vulvar tenderness intermittently.  Urine and bowel movements normal.  Breasts normal.  Body mass index 28.72.  Not exercising regularly.  Healthy nutrition.    Past medical history,surgical history, family history and social history were all reviewed and documented in the EPIC chart.  Gynecologic History Patient's last menstrual period was 10/23/2019.  Obstetric History OB History  Gravida Para Term Preterm AB Living  1 1 1  0 0 1  SAB TAB Ectopic Multiple Live Births  0 0 0 0 1    # Outcome Date GA Lbr Len/2nd Weight Sex Delivery Anes PTL Lv  1 Term 05/29/19 [redacted]w[redacted]d 10:19 / 00:17 6 lb 14.8 oz (3.14 kg) F Vag-Spont EPI  LIV     ROS: A ROS was performed and pertinent positives and negatives are included in the history.  GENERAL: No fevers or chills. HEENT: No change in vision, no earache, sore throat or sinus congestion. NECK: No pain or stiffness. CARDIOVASCULAR: No chest pain or pressure. No palpitations. PULMONARY: No shortness of breath, cough or wheeze. GASTROINTESTINAL: No abdominal pain, nausea, vomiting or diarrhea, melena or bright red blood per rectum. GENITOURINARY: No urinary frequency, urgency, hesitancy or dysuria. MUSCULOSKELETAL: No joint or muscle pain, no back pain, no recent trauma. DERMATOLOGIC: No rash, no itching, no lesions. ENDOCRINE: No polyuria, polydipsia, no heat or cold intolerance. No recent change in weight. HEMATOLOGICAL: No anemia or easy bruising or bleeding. NEUROLOGIC: No headache, seizures, numbness, tingling or weakness. PSYCHIATRIC: No depression, no loss of interest in normal activity or change in sleep pattern.     Exam:   BP 120/82 (BP Location: Right Arm, Patient  Position: Sitting, Cuff Size: Normal)   Ht 5\' 1"  (1.549 m)   Wt 152 lb (68.9 kg)   LMP 10/23/2019   Breastfeeding Yes   BMI 28.72 kg/m   Body mass index is 28.72 kg/m.  General appearance : Well developed well nourished female. No acute distress HEENT: Eyes: no retinal hemorrhage or exudates,  Neck supple, trachea midline, no carotid bruits, no thyroidmegaly Lungs: Clear to auscultation, no rhonchi or wheezes, or rib retractions  Heart: Regular rate and rhythm, no murmurs or gallops Breast:Examined in sitting and supine position were symmetrical in appearance, no palpable masses or tenderness,  no skin retraction, no nipple inversion, no nipple discharge, no skin discoloration, no axillary or supraclavicular lymphadenopathy Abdomen: no palpable masses or tenderness, no rebound or guarding Extremities: no edema or skin discoloration or tenderness  Pelvic: Vulva: Normal             Vagina: No gross lesions or discharge  Cervix: No gross lesions or discharge.  Pap reflex done.  Uterus  AV, normal size, shape and consistency, non-tender and mobile  Adnexa  Without masses or tenderness  Anus: Normal   Assessment/Plan:  29 y.o. female for annual exam   1. Encounter for routine gynecological examination with Papanicolaou smear of cervix Normal gynecologic exam.  Pap reflex done.  Breast exam normal.  Body mass index 28.72.  Continue with fitness and healthy nutrition.  2. ASCUS of cervix with negative high risk HPV Pap test done today.  3. Use of condoms for contraception Declines alternatives.  Other orders - Pap IG w/  reflex to HPV when ASC-U  [name], 12:01 PM 01/08/2020

## 2020-01-11 ENCOUNTER — Encounter: Payer: Self-pay | Admitting: Obstetrics & Gynecology

## 2020-01-11 LAB — PAP IG W/ RFLX HPV ASCU

## 2020-04-24 ENCOUNTER — Telehealth: Payer: Self-pay | Admitting: *Deleted

## 2020-04-24 NOTE — Telephone Encounter (Signed)
VM message left by pt which was in [place]. Interpreter Eda Royal reviewed the message and stated that pt is asking for refill of her migraine medicine. Per chart review, pt had PP visit w/Terri Burleson on 07/02/19. She subsequently had Annual Gyn exam with Dr. Seymour Bars on 01/08/20 and is scheduled for next Gyn exam w/Dr. Seymour Bars on 01/08/21.   1135  Pt was called with assistance from interpreter [facility]. She was advised that because she has transferred her care to a different practice, she will need to contact that office with her medication request. Pt stated that she had previously tried to contact our office for appointment however was unable to speak with anyone to schedule an appt and this is why she went to another practice. I apologized to pt for her difficulty or confusion with scheduling an appointment. Pt was again advised that she will need to contact Dr. Sharol Roussel office with her request as s he is now an active pt from that practice. Pt voiced understanding.

## 2020-04-24 NOTE — Telephone Encounter (Signed)
Patient called and left message in triage voicemail speaking in spanish. I forwarded the call to your personal voicemail to list to the message so you can speak with the patient.

## 2020-09-24 ENCOUNTER — Encounter: Payer: Medicaid Other | Admitting: Obstetrics and Gynecology

## 2021-01-08 ENCOUNTER — Other Ambulatory Visit (HOSPITAL_COMMUNITY)
Admission: RE | Admit: 2021-01-08 | Discharge: 2021-01-08 | Disposition: A | Discharge status: Home / Self Care | Payer: Medicaid Other | Source: Ambulatory Visit | Attending: Obstetrics & Gynecology | Admitting: Obstetrics & Gynecology

## 2021-01-08 ENCOUNTER — Ambulatory Visit (INDEPENDENT_AMBULATORY_CARE_PROVIDER_SITE_OTHER): Payer: Medicaid Other | Admitting: Obstetrics & Gynecology

## 2021-01-08 ENCOUNTER — Other Ambulatory Visit: Payer: Self-pay

## 2021-01-08 ENCOUNTER — Encounter: Payer: Self-pay | Admitting: Obstetrics & Gynecology

## 2021-01-08 VITALS — BP 110/80 | HR 85 | Resp 16 | Ht 61.0 in | Wt 162.0 lb

## 2021-01-08 DIAGNOSIS — Z789 Other specified health status: Secondary | ICD-10-CM

## 2021-01-08 DIAGNOSIS — Z01419 Encounter for gynecological examination (general) (routine) without abnormal findings: Secondary | ICD-10-CM | POA: Insufficient documentation

## 2021-01-08 DIAGNOSIS — Z6830 Body mass index (BMI) 30.0-30.9, adult: Secondary | ICD-10-CM | POA: Diagnosis not present

## 2021-01-08 DIAGNOSIS — E6609 Other obesity due to excess calories: Secondary | ICD-10-CM

## 2021-01-08 NOTE — Progress Notes (Signed)
Bentlee Drier Oct 03, 1990 [id number]   History:    30 y.o. G1P1L1 Married.  Daughter 1 1/2 yo.   RP:  Established patient presenting for annual gyn exam    HPI: Menses regular normal flow since stopped BF 6 months ago.  LMP 12/08/2020 normal.  No pelvic pain. Conception on Metformin/Clomid/IUI, SVD, induction for Pre-Eclampsia.  Using condoms.  No pain with IC. Urine and bowel movements normal.  Breasts normal.  Body mass index 30.61.  Not exercising regularly.  Healthy nutrition.      Past medical history,surgical history, family history and social history were all reviewed and documented in the EPIC chart.  Gynecologic History Patient's last menstrual period was 01/02/2021 (exact date).  Obstetric History OB History  Gravida Para Term Preterm AB Living  1 1 1  0 0 1  SAB IAB Ectopic Multiple Live Births  0 0 0 0 1    # Outcome Date GA Lbr Len/2nd Weight Sex Delivery Anes PTL Lv  1 Term 05/29/19 [redacted]w[redacted]d 10:19 / 00:17 6 lb 14.8 oz (3.14 kg) F Vag-Spont EPI  LIV     ROS: A ROS was performed and pertinent positives and negatives are included in the history.  GENERAL: No fevers or chills. HEENT: No change in vision, no earache, sore throat or sinus congestion. NECK: No pain or stiffness. CARDIOVASCULAR: No chest pain or pressure. No palpitations. PULMONARY: No shortness of breath, cough or wheeze. GASTROINTESTINAL: No abdominal pain, nausea, vomiting or diarrhea, melena or bright red blood per rectum. GENITOURINARY: No urinary frequency, urgency, hesitancy or dysuria. MUSCULOSKELETAL: No joint or muscle pain, no back pain, no recent trauma. DERMATOLOGIC: No rash, no itching, no lesions. ENDOCRINE: No polyuria, polydipsia, no heat or cold intolerance. No recent change in weight. HEMATOLOGICAL: No anemia or easy bruising or bleeding. NEUROLOGIC: No headache, seizures, numbness, tingling or weakness. PSYCHIATRIC: No depression, no loss of interest in normal activity or change in sleep pattern.      Exam:   BP 110/80   Pulse 85   Resp 16   Ht 5\' 1"  (1.549 m)   Wt 162 lb (73.5 kg)   LMP 01/02/2021 (Exact Date)   BMI 30.61 kg/m   Body mass index is 30.61 kg/m.  General appearance : Well developed well nourished female. No acute distress HEENT: Eyes: no retinal hemorrhage or exudates,  Neck supple, trachea midline, no carotid bruits, no thyroidmegaly Lungs: Clear to auscultation, no rhonchi or wheezes, or rib retractions  Heart: Regular rate and rhythm, no murmurs or gallops Breast:Examined in sitting and supine position were symmetrical in appearance, no palpable masses or tenderness,  no skin retraction, no nipple inversion, no nipple discharge, no skin discoloration, no axillary or supraclavicular lymphadenopathy Abdomen: no palpable masses or tenderness, no rebound or guarding Extremities: no edema or skin discoloration or tenderness  Pelvic: Vulva: Normal             Vagina: No gross lesions or discharge  Cervix: No gross lesions or discharge.  Pap reflex done.  Uterus  AV, normal size, shape and consistency, non-tender and mobile  Adnexa  Without masses or tenderness  Anus: Normal   Assessment/Plan:  30 y.o. female for annual exam   1. Encounter for routine gynecological examination with Papanicolaou smear of cervix Normal gynecologic exam.  Pap reflex done.  Breast exam normal. - Cytology - PAP( [place])  2. Use of condoms for contraception  3. Class 1 obesity due to excess calories without serious comorbidity  with body mass index (BMI) of 30.0 to 30.9 in adult Recommend a lower calorie/carb diet.  Aerobic activities 5 times a week and light weightlifting every 2 days.  Other orders - Multiple Vitamin (MULTIVITAMIN PO); Take by mouth.   [name], 8:50 AM 01/08/2021

## 2021-01-12 LAB — CYTOLOGY - PAP
Diagnosis: NEGATIVE
Diagnosis: REACTIVE

## 2021-06-23 IMAGING — US US MFM OB FOLLOW-UP
1 series · 13 of 28 positions shown · non-contrast
Comparison: none

[Series 1: us mfm ob follow-up · 13 of 60 slices shown]
[im 3/60]
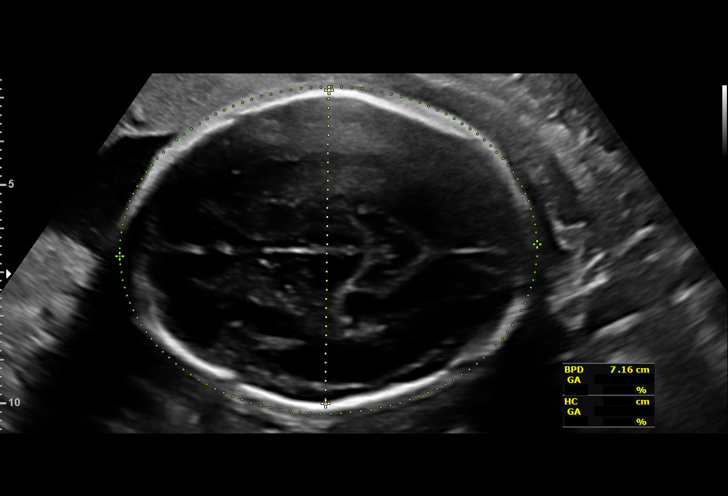
[im 7/60]
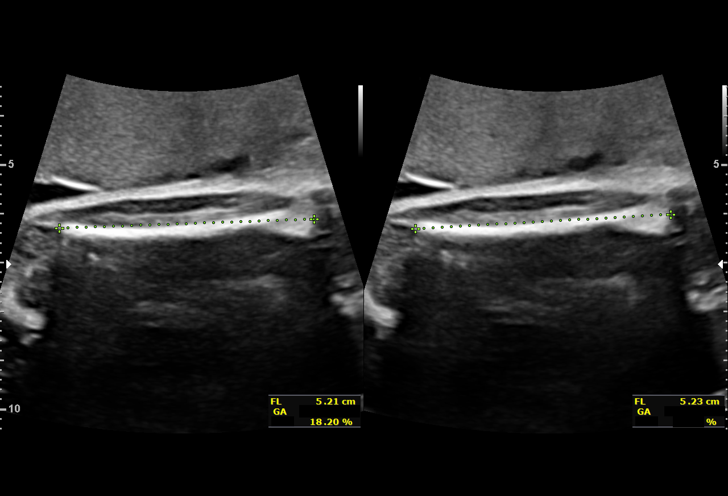
[im 11/60]
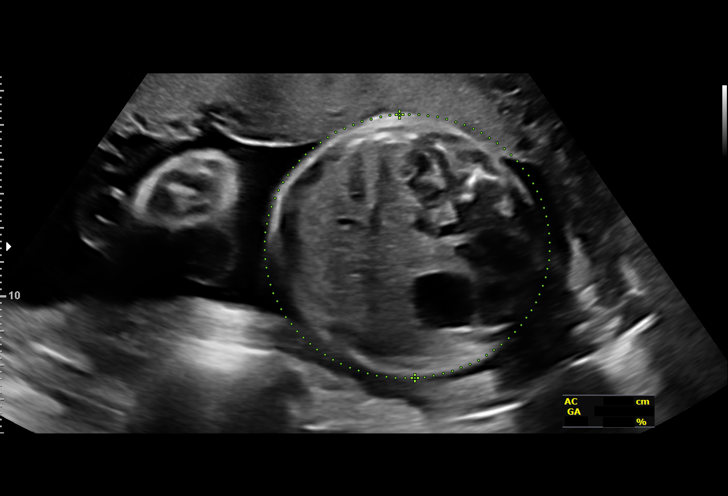
[im 16/60]
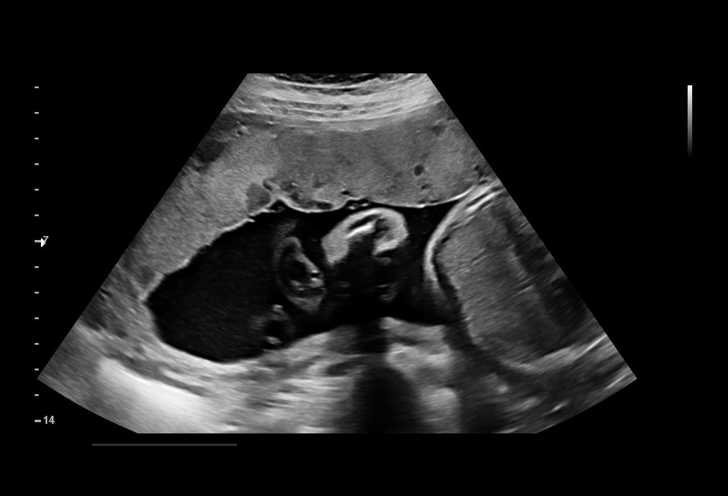
[im 20/60]
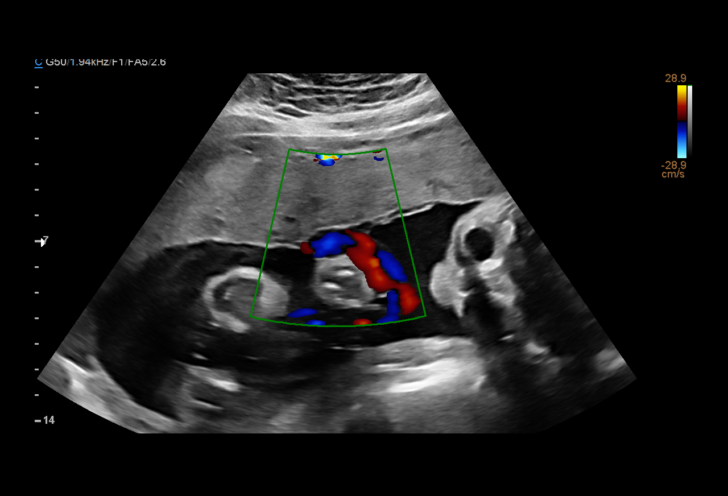
[im 25/60]
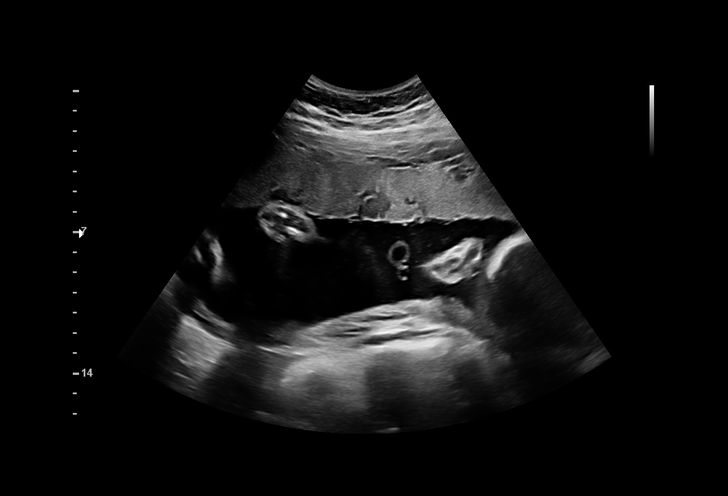
[im 31/60]
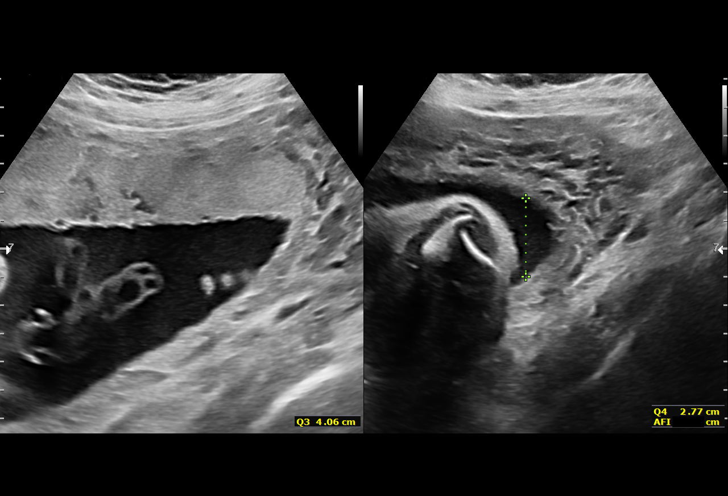
[im 35/60]
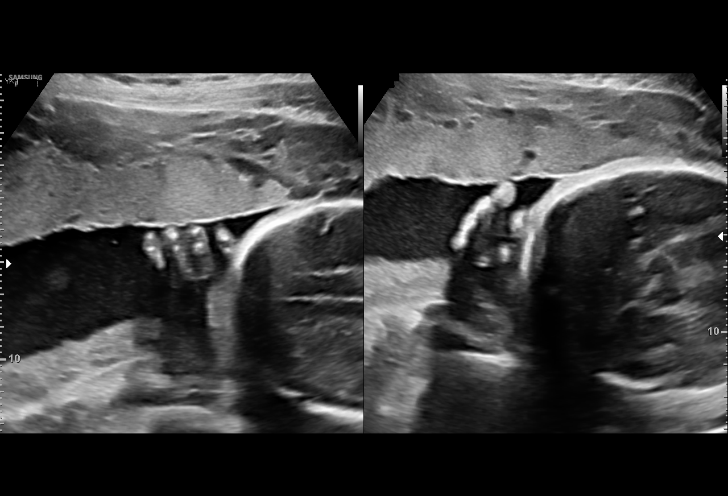
[im 40/60]
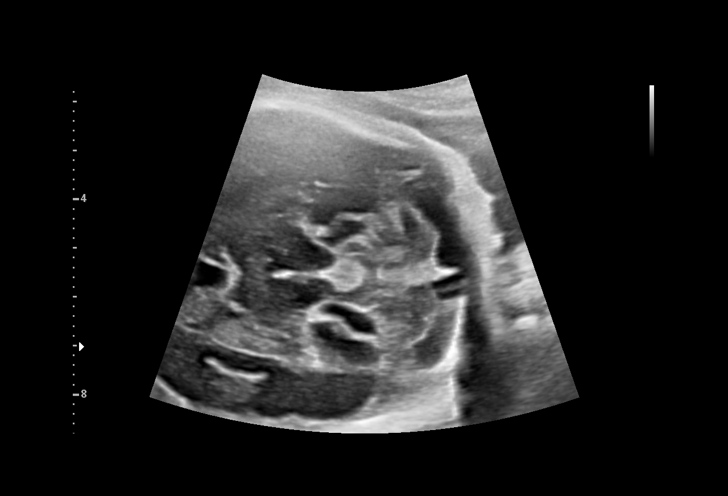
[im 44/60]
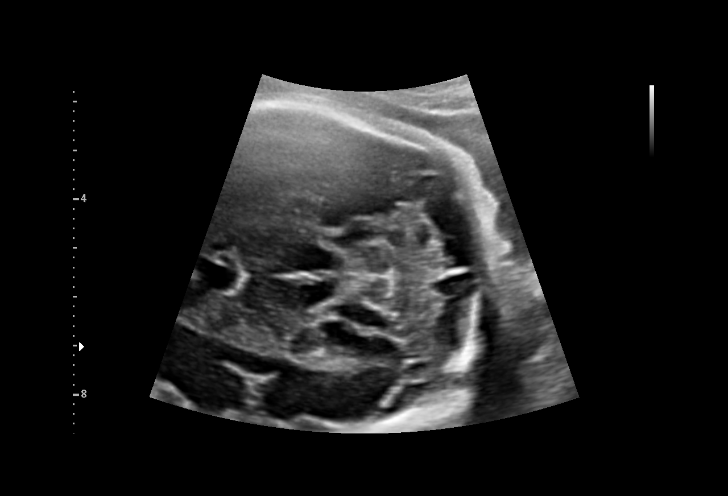
[im 49/60]
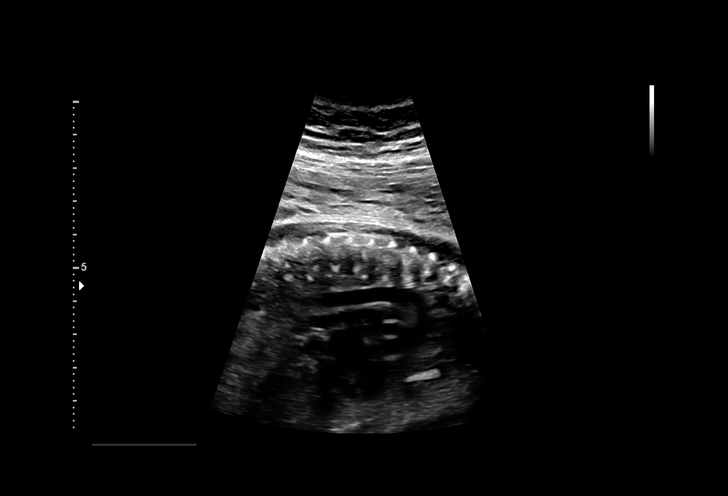
[im 53/60]
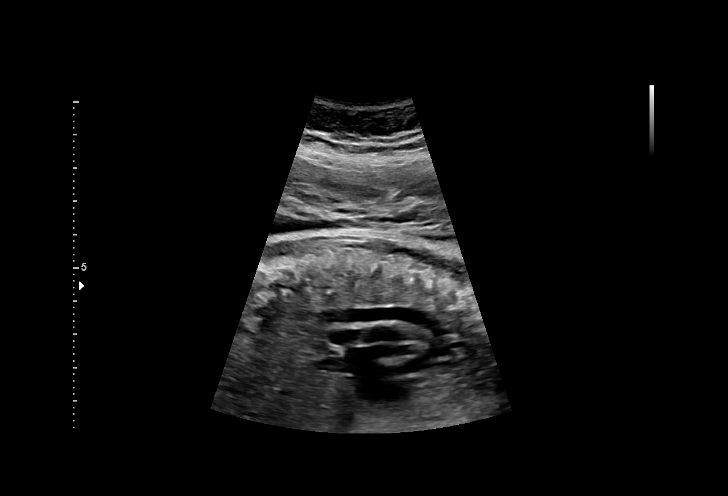
[im 57/60]
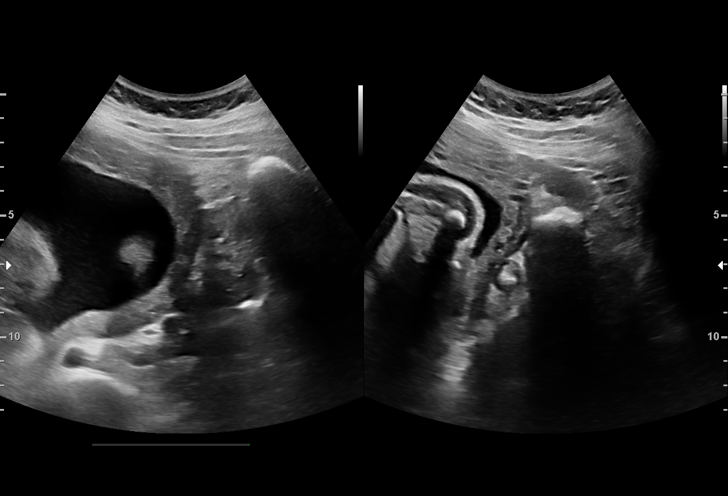

[13 of 28 positions shown; findings below may reference images not displayed]

[address]

 ----------------------------------------------------------------------

 ----------------------------------------------------------------------
Indications

  Gestational diabetes in pregnancy,
  controlled by diet
  Pregnancy resulting from assisted
  reproductive technology
  28 weeks gestation of pregnancy
 ----------------------------------------------------------------------
Vital Signs

 BMI:
Fetal Evaluation

 Num Of Fetuses:         1
 Fetal Heart Rate(bpm):  161
 Cardiac Activity:       Observed
 Presentation:           Cephalic
 Placenta:               Anterior
 P. Cord Insertion:      Visualized, central

 Amniotic Fluid
 AFI FV:      Within normal limits

 AFI Sum(cm)     %Tile       Largest Pocket(cm)
 17.08           64

 RUQ(cm)       RLQ(cm)       LUQ(cm)        LLQ(cm)


 Comment:    No placental abruption or previa identified.
Biometry

 BPD:      71.7  mm     G. Age:  28w 5d         49  %    CI:         72.1   %    70 - 86
                                                         FL/HC:      19.4   %    18.8 -
 HC:      268.7  mm     G. Age:  29w 2d         44  %    HC/AC:      1.04        1.05 -
 AC:      258.3  mm     G. Age:  30w 0d         86  %    FL/BPD:     72.5   %    71 - 87
 FL:         52  mm     G. Age:  27w 5d         17  %    FL/AC:      20.1   %    20 - 24

 Est. FW:    7552  gm    2 lb 15 oz      64  %
OB History

 Gravidity:    1         Term:   0        Prem:   0        SAB:   0
 TOP:          0       Ectopic:  0        Living: 0
Gestational Age

 LMP:           28w 3d        Date:  09/02/18                 EDD:   06/09/19
 U/S Today:     29w 0d                                        EDD:   06/05/19
 Best:          28w 3d     Det. By:  LMP  (09/02/18)          EDD:   06/09/19
Anatomy

 Cranium:               Appears normal         Aortic Arch:            Appears normal
 Cavum:                 Appears normal         Ductal Arch:            Previously seen
 Ventricles:            Appears normal         Diaphragm:              Previously seen
 Choroid Plexus:        Previously seen        Stomach:                Appears normal, left
                                                                       sided
 Cerebellum:            Appears normal         Abdomen:                Appears normal
 Posterior Fossa:       Appears normal         Abdominal Wall:         Appears nml (cord
                                                                       insert, abd wall)
 Nuchal Fold:           Not applicable (>20    Cord Vessels:           Appears normal (3
                        wks GA)                                        vessel cord)
 Face:                  Orbits and profile     Kidneys:                Appear normal
                        previously seen
 Lips:                  Previously seen        Bladder:                Appears normal
 Thoracic:              Appears normal         Spine:                  Previously seen
 Heart:                 Previously seen        Upper Extremities:      Previously seen
 RVOT:                  Appears normal         Lower Extremities:      Previously seen
 LVOT:                  Appears normal

 Other:  Fetus appears to be female. Heels and nasal bone previously
         visualized.  Hands  well visualized today.
Cervix Uterus Adnexa

 Cervix
 Normal appearance by transabdominal scan.

 Uterus
 No abnormality visualized.

 Left Ovary
 No adnexal mass visualized.

 Right Ovary
 Within normal limits.

 Cul De Sac
 No free fluid seen.

 Adnexa
 No abnormality visualized.
Impression

 Normal interval growth.
 CH26J good control
 Good fetal movement and amniotic

 Prior exams suggested diabetes however recent prenatal visit
 on 11/239 ([REDACTED]) noted that she was diet controlled. Ms.
 Ishag also denies taking additional medication. Her prior
 labs on [DATE] and [DATE]she had an abnormal 2hr GTT but a
 SbO6c of 5.2% she was 13 weeks at that time. Therefore, we
 will continue treating Ms. Ishag as a pregestational
 diabetes.
Recommendations

 Follow up growth in 4 weeks.

 Consider weekly testing at 32 weeks.

## 2021-08-18 IMAGING — US US MFM OB FOLLOW-UP
1 series · 14 of 28 positions shown · non-contrast
Comparison: none

[Series 1: us mfm ob follow-up · 50 acquisitions, 14 frames shown]
[im 2/50]
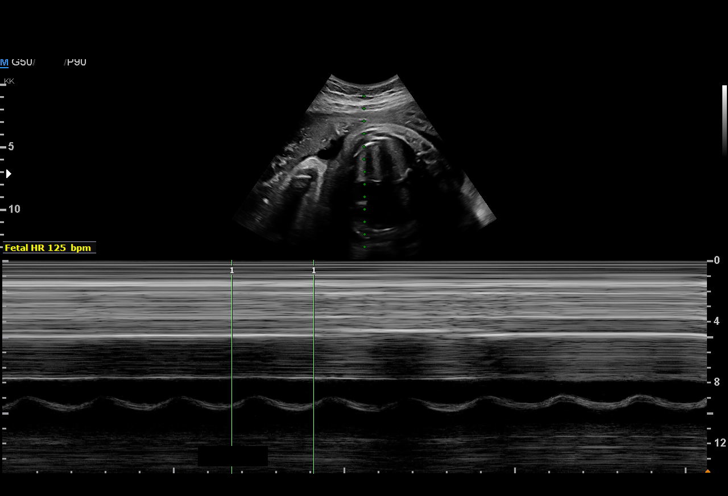
[im 6/50]
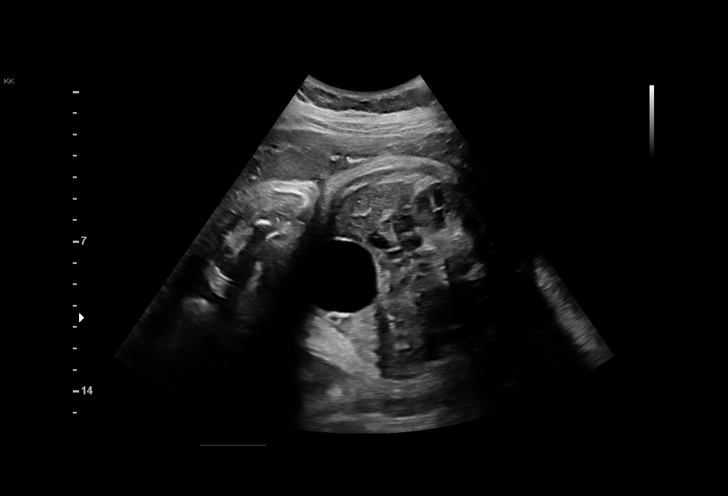
[im 10/50]
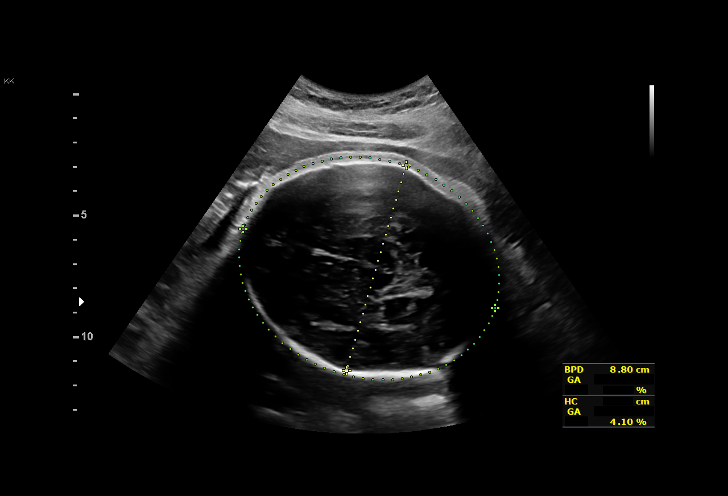
[im 13/50]
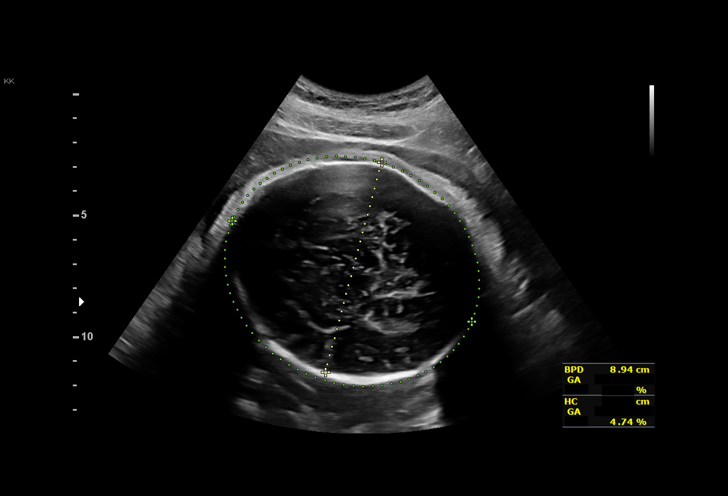
[im 17/50]
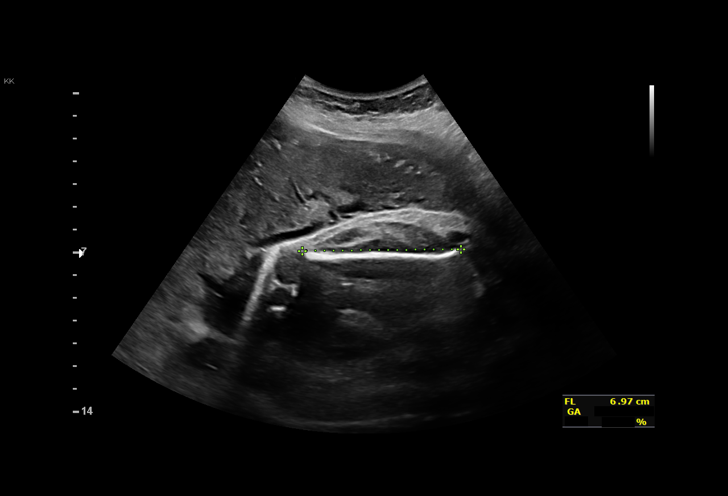
[im 20/50]
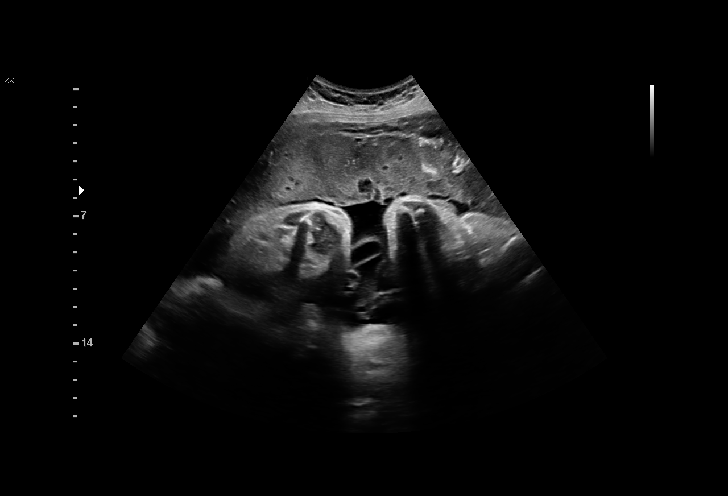
[im 24/50]
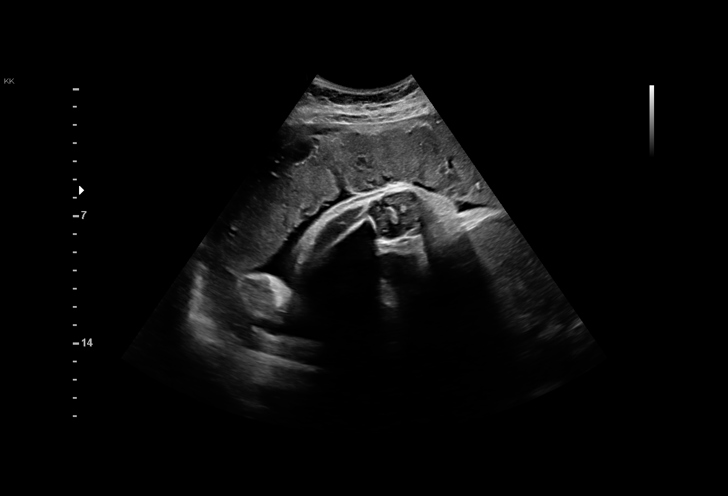
[im 28/50]
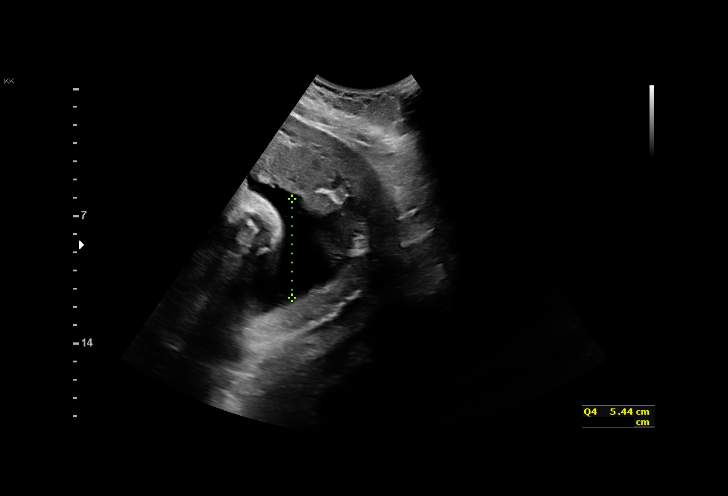
[im 31/50]
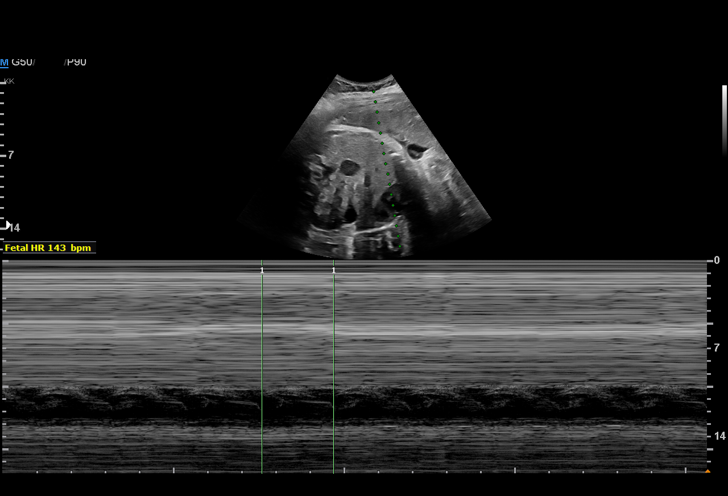
[im 35/50]
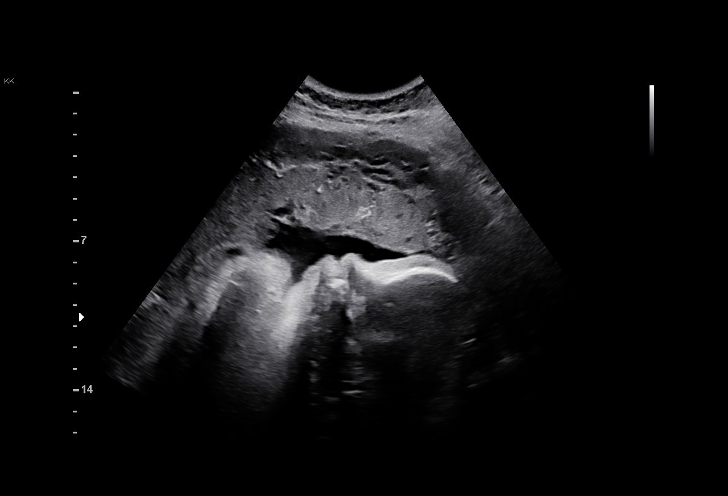
[im 39/50]
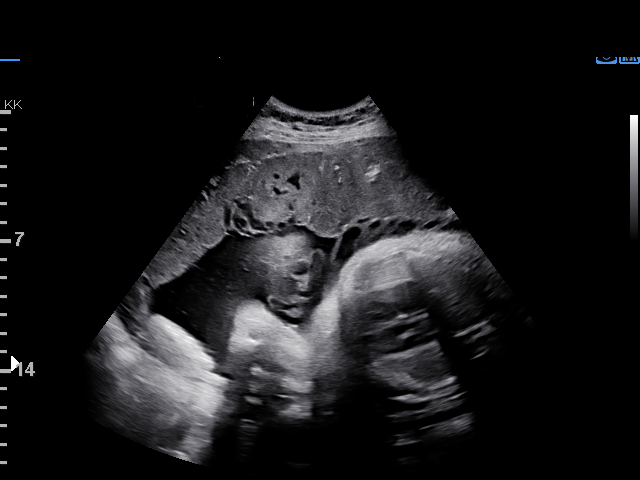
[im 42/50]
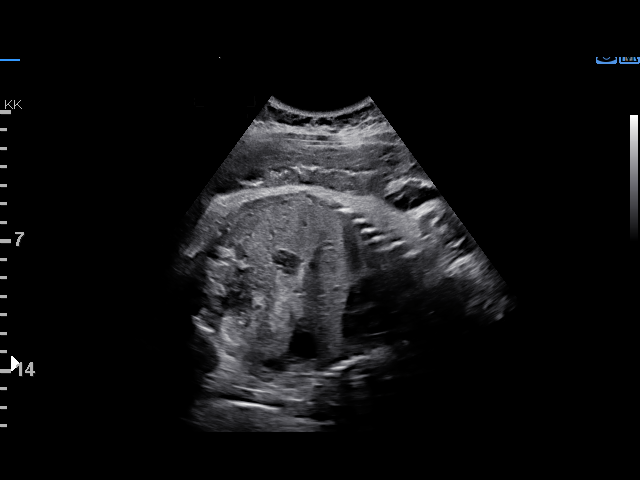
[im 46/50]
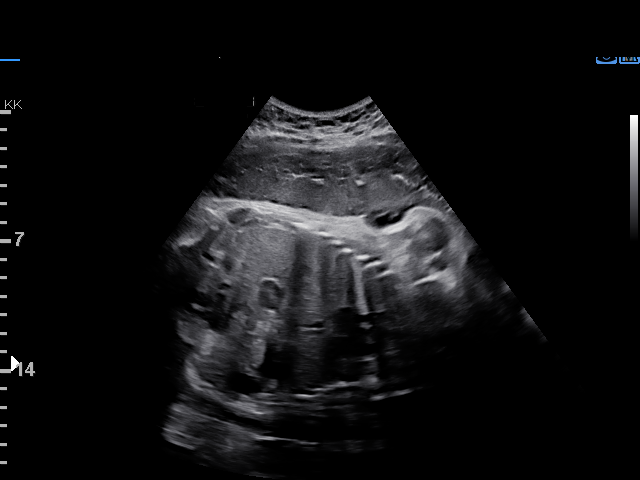
[im 50/50]
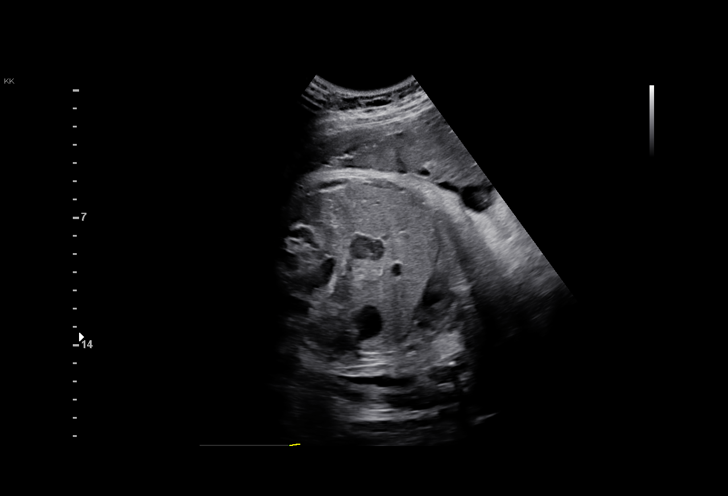

[14 of 28 positions shown; findings below may reference images not displayed]

[address]

                                                       [place]
 ----------------------------------------------------------------------

 ----------------------------------------------------------------------
Indications

  Gestational diabetes in pregnancy,
  controlled by diet
  Pregnancy resulting from assisted
  reproductive technology
  Encounter for other antenatal screening
  follow-up
  36 weeks gestation of pregnancy
 ----------------------------------------------------------------------
Vital Signs

                                                Height:        5'1"
Fetal Evaluation

 Num Of Fetuses:         1
 Fetal Heart Rate(bpm):  125
 Cardiac Activity:       Observed
 Presentation:           Cephalic
 Placenta:               Anterior
 P. Cord Insertion:      Previously Visualized

 Amniotic Fluid
 AFI FV:      Within normal limits

 AFI Sum(cm)     %Tile       Largest Pocket(cm)
 20.18           77

 RUQ(cm)       RLQ(cm)       LUQ(cm)        LLQ(cm)

Biophysical Evaluation

 Amniotic F.V:   Pocket => 2 cm             F. Tone:        Observed
 F. Movement:    Observed                   Score:          [DATE]
 F. Breathing:   Observed
Biometry

 BPD:      88.7  mm     G. Age:  35w 6d         45  %    CI:        79.51   %    70 - 86
                                                         FL/HC:      22.4   %    20.1 -
 HC:      314.4  mm     G. Age:  35w 2d          6  %    HC/AC:      0.86        0.93 -
 AC:      364.3  mm     G. Age:  40w 2d       > 99  %    FL/BPD:     79.3   %    71 - 87
 FL:       70.3  mm     G. Age:  36w 0d         37  %    FL/AC:      19.3   %    20 - 24

 Est. FW:    9333  gm    7 lb 10 oz      93  %
OB History

 Gravidity:    1         Term:   0        Prem:   0        SAB:   0
 TOP:          0       Ectopic:  0        Living: 0
Gestational Age

 LMP:           36w 3d        Date:  09/02/18                 EDD:   06/09/19
 U/S Today:     36w 6d                                        EDD:   06/06/19
 Best:          36w 3d     Det. By:  LMP  (09/02/18)          EDD:   06/09/19
Anatomy

 Cranium:               Appears normal         Aortic Arch:            Previously seen
 Cavum:                 Previously seen        Ductal Arch:            Previously seen
 Ventricles:            Appears normal         Diaphragm:              Appears normal
 Choroid Plexus:        Previously seen        Stomach:                Appears normal, left
                                                                       sided
 Cerebellum:            Previously seen        Abdomen:                Appears normal
 Posterior Fossa:       Previously seen        Abdominal Wall:         Previously seen
 Nuchal Fold:           Not applicable (>20    Cord Vessels:           Previously seen
                        wks GA)
 Face:                  Orbits and profile     Kidneys:                Appear normal
                        previously seen
 Lips:                  Previously seen        Bladder:                Appears normal
 Thoracic:              Appears normal         Spine:                  Previously seen
 Heart:                 Previously seen        Upper Extremities:      Previously seen
 RVOT:                  Previously seen        Lower Extremities:      Previously seen
 LVOT:                  Previously seen

 Other:  Fetus appears to be female. Heels, Nasal bone, and Hands
         previously visualized.
Cervix Uterus Adnexa

 Cervix
 Not visualized (advanced GA >18wks)
Comments

 This patient was seen for a follow up growth scan due to A1
 gestational diabetes.
 She was informed that the fetal growth measures in the upper
 normal limits and the amniotic fluid level appears appropriate
 for her gestational age.
 A biophysical profile performed today was [DATE].
 Another biophysical profile was scheduled in 1 week.

## 2022-01-13 ENCOUNTER — Ambulatory Visit: Payer: Medicaid Other | Admitting: Obstetrics & Gynecology

## 2022-01-20 ENCOUNTER — Other Ambulatory Visit (HOSPITAL_COMMUNITY)
Admission: RE | Admit: 2022-01-20 | Discharge: 2022-01-20 | Disposition: A | Discharge status: Home / Self Care | Payer: Medicaid Other | Source: Ambulatory Visit | Attending: Obstetrics & Gynecology | Admitting: Obstetrics & Gynecology

## 2022-01-20 ENCOUNTER — Ambulatory Visit (INDEPENDENT_AMBULATORY_CARE_PROVIDER_SITE_OTHER): Payer: Medicaid Other | Admitting: Obstetrics & Gynecology

## 2022-01-20 ENCOUNTER — Telehealth: Payer: Self-pay

## 2022-01-20 ENCOUNTER — Encounter: Payer: Self-pay | Admitting: Obstetrics & Gynecology

## 2022-01-20 VITALS — BP 112/70 | HR 79 | Ht 60.75 in | Wt 167.0 lb

## 2022-01-20 DIAGNOSIS — L989 Disorder of the skin and subcutaneous tissue, unspecified: Secondary | ICD-10-CM

## 2022-01-20 DIAGNOSIS — Z01419 Encounter for gynecological examination (general) (routine) without abnormal findings: Secondary | ICD-10-CM | POA: Insufficient documentation

## 2022-01-20 DIAGNOSIS — Z3009 Encounter for other general counseling and advice on contraception: Secondary | ICD-10-CM

## 2022-01-20 NOTE — Progress Notes (Signed)
Adriana Hahn [birth year] [id number]   History:    31 y.o.  G1P1L1 Married.  Daughter 2 1/2 yo.   RP:  Established patient presenting for annual gyn exam    HPI: Menses regular normal flow every month.  LMP 01/05/2022 normal.  No pelvic pain. Conception on Metformin/Clomid/IUI, SVD, induction for Pre-Eclampsia.  Not using contraception, would like to conceive.  No pain with IC, but some dryness.  Recommend coconut oil as needed. Pap Neg 01/2021.  Pap reflex today. C/O skin lesions, dry spots on lower abdomen and legs.  Will refer to Dermato.  Urine and bowel movements normal. Breasts normal.  Body mass index 31.81.  Not exercising regularly. Healthy nutrition.     Aex//jj Pap: 01-08-21  Past medical history,surgical history, family history and social history were all reviewed and documented in the EPIC chart.  Gynecologic History Patient's last menstrual period was 01/05/2022 (exact date).  Obstetric History OB History  Gravida Para Term Preterm AB Living  1 1 1  0 0 1  SAB IAB Ectopic Multiple Live Births  0 0 0 0 1    # Outcome Date GA Lbr Len/2nd Weight Sex Delivery Anes PTL Lv  1 Term 05/29/19 [redacted]w[redacted]d 10:19 / 00:17 6 lb 14.8 oz (3.14 kg) F Vag-Spont EPI  LIV     ROS: A ROS was performed and pertinent positives and negatives are included in the history.  GENERAL: No fevers or chills. HEENT: No change in vision, no earache, sore throat or sinus congestion. NECK: No pain or stiffness. CARDIOVASCULAR: No chest pain or pressure. No palpitations. PULMONARY: No shortness of breath, cough or wheeze. GASTROINTESTINAL: No abdominal pain, nausea, vomiting or diarrhea, melena or bright red blood per rectum. GENITOURINARY: No urinary frequency, urgency, hesitancy or dysuria. MUSCULOSKELETAL: No joint or muscle pain, no back pain, no recent trauma. DERMATOLOGIC: No rash, no itching, no lesions. ENDOCRINE: No polyuria, polydipsia, no heat or cold intolerance. No recent change in weight.  HEMATOLOGICAL: No anemia or easy bruising or bleeding. NEUROLOGIC: No headache, seizures, numbness, tingling or weakness. PSYCHIATRIC: No depression, no loss of interest in normal activity or change in sleep pattern.     Exam:   BP 112/70   Pulse 79   Ht 5' 0.75" (1.543 m)   Wt 167 lb (75.8 kg)   LMP 01/05/2022 (Exact Date)   SpO2 97%   BMI 31.81 kg/m   Body mass index is 31.81 kg/m.  General appearance : Well developed well nourished female. No acute distress HEENT: Eyes: no retinal hemorrhage or exudates,  Neck supple, trachea midline, no carotid bruits, no thyroidmegaly Skin:  Dry circular spots on lower abdo and legs Lungs: Clear to auscultation, no rhonchi or wheezes, or rib retractions  Heart: Regular rate and rhythm, no murmurs or gallops Breast:Examined in sitting and supine position were symmetrical in appearance, no palpable masses or tenderness,  no skin retraction, no nipple inversion, no nipple discharge, no skin discoloration, no axillary or supraclavicular lymphadenopathy Abdomen: no palpable masses or tenderness, no rebound or guarding Extremities: no edema or skin discoloration or tenderness  Pelvic: Vulva: Normal             Vagina: No gross lesions or discharge  Cervix: No gross lesions or discharge.  Pap reflex done.  Uterus  AV, normal size, shape and consistency, non-tender and mobile  Adnexa  Without masses or tenderness  Anus: Normal   Assessment/Plan:  31 y.o. female for annual exam   1. Encounter  for routine gynecological examination with Papanicolaou smear of cervix Menses regular normal flow every month.  LMP 01/05/2022 normal.  No pelvic pain. Conception on Metformin/Clomid/IUI, SVD, induction for Pre-Eclampsia.  Not using contraception, would like to conceive.  No pain with IC, but some dryness.  Recommend coconut oil as needed. Pap Neg 01/2021.  Pap reflex today. C/O skin lesions, dry spots on lower abdomen and legs.  Will refer to Dermato.  Urine  and bowel movements normal. Breasts normal.  Body mass index 31.81.  Not exercising regularly. Healthy nutrition.   - Cytology - PAP( [place])  2. Encounter for other general counseling or advice on contraception Declines contraception, would like to conceive.  3. Skin lesion  Possible ring worms.  Refer to Dermato.  [name], 10:22 AM 01/20/2022

## 2022-01-20 NOTE — Telephone Encounter (Signed)
Amb ref placed for dermatology.

## 2022-01-20 NOTE — Telephone Encounter (Signed)
-----   Message from Princess Bruins, MD sent at 01/20/2022 10:46 AM EDT ----- Regarding: Refer to Dermato  C/O skin lesions, dry spots on lower abdomen and legs.  Possible ring worms.  Refer to Dermato.

## 2022-01-21 NOTE — Telephone Encounter (Signed)
Appt scheduled 11/16/2022 at 3:30pm.

## 2022-01-22 LAB — CYTOLOGY - PAP: Diagnosis: NEGATIVE

## 2022-01-28 ENCOUNTER — Ambulatory Visit: Payer: Medicaid Other | Admitting: Obstetrics & Gynecology

## 2022-01-28 ENCOUNTER — Encounter: Payer: Self-pay | Admitting: Obstetrics & Gynecology

## 2022-01-28 VITALS — BP 110/80 | HR 82

## 2022-01-28 DIAGNOSIS — E282 Polycystic ovarian syndrome: Secondary | ICD-10-CM

## 2022-01-28 DIAGNOSIS — L659 Nonscarring hair loss, unspecified: Secondary | ICD-10-CM | POA: Diagnosis not present

## 2022-01-28 MED ORDER — NORETHIN ACE-ETH ESTRAD-FE 1-20 MG-MCG(24) PO TABS: 1.0000 | ORAL_TABLET | Freq: Every day | ORAL | 4 refills | Status: DC

## 2022-01-28 NOTE — Progress Notes (Signed)
    Nikitta Sobiech [birth year] [id number]        31 y.o.  G1P1L1 Married.  Daughter is 2 1/2 yo.  RP: Hair loss x a few weeks  HPI: General hair loss, as she sees more hair on her hair brush. No area of complete hair loss.  Per patient, has had that problem in the past associated with periods of stress.  Tried Biotin supplement, but didn't help. Menses regular normal flow every month.  LMP 01/05/2022 normal.  No pelvic pain. H/O conception on Metformin/Clomid/IUI with Dx of PCOS at that time, SVD, induction for Pre-Eclampsia.  Not using contraception currently.  C/O skin lesions, dry spots on lower abdomen and legs.  Body mass index 31.81.  Not exercising regularly. Healthy nutrition.       OB History  Gravida Para Term Preterm AB Living  1 1 1  0 0 1  SAB IAB Ectopic Multiple Live Births  0 0 0 0 1    # Outcome Date GA Lbr Len/2nd Weight Sex Delivery Anes PTL Lv  1 Term 05/29/19 [redacted]w[redacted]d 10:19 / 00:17 6 lb 14.8 oz (3.14 kg) F Vag-Spont EPI  LIV    Past medical history,surgical history, problem list, medications, allergies, family history and social history were all reviewed and documented in the EPIC chart.   Directed ROS with pertinent positives and negatives documented in the history of present illness/assessment and plan.  Exam:  Vitals:   01/28/22 1133  BP: 110/80  Pulse: 82  SpO2: 98%   General appearance:  Normal  Good hair volume.  No alopecia.  Gynecologic exam: Deferred.  Annual-Gyn Exam normal 01/20/22.   Assessment/Plan:  31 y.o. G1P1001   1. Hair loss General hair loss, as she sees more hair on her hair brush. No area of complete hair loss.  Per patient, has had that problem in the past associated with periods of stress.  Tried Biotin supplement, but didn't help. Menses regular normal flow every month.  LMP 01/05/2022 normal.  No pelvic pain. H/O conception on Metformin/Clomid/IUI with Dx of PCOS at that time, SVD, induction for Pre-Eclampsia.  Not using contraception  currently.  C/O skin lesions, dry spots on lower abdomen and legs.  Body mass index 31.81.  Not exercising regularly. Healthy nutrition.  Mild general hair loss recently.  Normal hair volume and no alopecia.  Counseling done.  Decision to refer to Dermato.  2. PCOS (polycystic ovarian syndrome) Decision to regulate patient's hormonal cycle with the BCPs for a while.  Start on BCPs with next menses.  The generic of LoEstrin 24 Fe 1/20 sent to pharmacy.  Other orders - Norethindrone Acetate-Ethinyl Estrad-FE (LOESTRIN 24 FE) 1-20 MG-MCG(24) tablet; Take 1 tablet by mouth daily.   Princess Bruins MD, 11:47 AM 01/28/2022

## 2022-02-24 ENCOUNTER — Ambulatory Visit: Payer: Medicaid Other | Admitting: Medical

## 2022-02-24 ENCOUNTER — Encounter: Payer: Self-pay | Admitting: Medical

## 2022-02-24 VITALS — BP 122/76 | HR 74 | Resp 18 | Ht 60.0 in | Wt 166.6 lb

## 2022-02-24 DIAGNOSIS — L659 Nonscarring hair loss, unspecified: Secondary | ICD-10-CM

## 2022-02-24 DIAGNOSIS — Z8632 Personal history of gestational diabetes: Secondary | ICD-10-CM | POA: Diagnosis not present

## 2022-02-24 DIAGNOSIS — R5383 Other fatigue: Secondary | ICD-10-CM | POA: Diagnosis not present

## 2022-02-24 DIAGNOSIS — L309 Dermatitis, unspecified: Secondary | ICD-10-CM

## 2022-02-24 DIAGNOSIS — Z23 Encounter for immunization: Secondary | ICD-10-CM

## 2022-02-24 NOTE — Patient Instructions (Addendum)
Hair loss for 6 months. Mild fatigue. Will get, cbc, cmp,  tsh and t4. Also refer to derm to see if can get quicker appointment.  For rt thigh area appear dry. Possible small area of eczema. Moisturize are daily and use triamcinolone twice.  Can try to find Palmer moisturizing cream once daily.(Vitamin E)  For gestational diabetes will get A1c today.  Follow up in 1 month or sooner if needed

## 2022-02-24 NOTE — Progress Notes (Signed)
   Subjective:    Patient ID: Adriana Hahn, female    DOB: April 14, 1990, 31 y.o.   MRN: [id number]  HPI  Pt in for follow.  Pt from [place]. Pt not working has one child. No exercise. Tying to eat healthy. One cup of coffe a day.   She has 3 years of skin discoloration. Random red area to lowe legs with itching and peeling. At times hurts. Appeared more when she was pregnant.  Past year still has rash only on area to upper thigh. Sometimes will have on upper arms.  Pt state recently has some hair loss for 6 months.  Pt states gyn tried to give ocp that may helped with weight loss. Seemed to not help.  Pt is on menses presently.  Only used vaseline in past. Pt never seen by dermalogist. She tried to see one and has upcoming appt in August 2024.   Pt had gestational diabetes.   Review of Systems  Constitutional:  Positive for fatigue.  Respiratory:  Negative for choking, shortness of breath and wheezing.   Cardiovascular:  Negative for chest pain and palpitations.  Genitourinary:  Negative for dysuria.  Musculoskeletal:  Negative for back pain.  Skin:        See hpi.  Neurological:  Negative for dizziness, tremors, syncope, light-headedness and headaches.  Hematological:  Negative for adenopathy. Does not bruise/bleed easily.  Psychiatric/Behavioral:  Negative for behavioral problems.        Objective:   Physical Exam  General- No acute distress. Pleasant patient. Neck- Full range of motion, no jvd. No thyromegaly. Lungs- Clear, even and unlabored. Heart- regular rate and rhythm. Neurologic- CNII- XII grossly intact.  Skin- rt upper thigh small circular dry faky are of skin about 1cm in diameter.hair is thick with mild thinning diffuse hair loss at best.    Assessment & Plan:   Patient Instructions  Hair loss for 6 months. Mild fatigue. Will get, cbc, cmp,  tsh and t4. Also refer to derm to see if can get quicker appointment.  For rt thigh area appear dry. Possible  small area of eczema. Moisturize are daily and use triamcinolone twice.  Can try to find Palmer moisturizing cream once daily.(Vitamin E)  For gestational diabetes will get A1c today.  Follow up in 1 month or sooner if needed        [employer], PA-C

## 2022-02-24 NOTE — Addendum Note (Signed)
Addended by: Thelma Barge D on: 02/24/2022 04:28 PM   Modules accepted: Orders

## 2022-02-24 NOTE — Addendum Note (Signed)
Addended by: Maximino Sarin on: 02/24/2022 04:19 PM   Modules accepted: Orders

## 2022-02-25 LAB — COMPREHENSIVE METABOLIC PANEL
AG Ratio: 1.7 (calc) (ref 1.0–2.5)
ALT: 17 U/L (ref 6–29)
AST: 14 U/L (ref 10–30)
Albumin: 4.6 g/dL (ref 3.6–5.1)
Alkaline phosphatase (APISO): 57 U/L (ref 31–125)
BUN: 17 mg/dL (ref 7–25)
CO2: 24 mmol/L (ref 20–32)
Calcium: 9.3 mg/dL (ref 8.6–10.2)
Chloride: 104 mmol/L (ref 98–110)
Creat: 0.93 mg/dL (ref 0.50–0.97)
Globulin: 2.7 g/dL (calc) (ref 1.9–3.7)
Glucose, Bld: 147 mg/dL — ABNORMAL HIGH (ref 65–99)
Potassium: 4.2 mmol/L (ref 3.5–5.3)
Sodium: 138 mmol/L (ref 135–146)
Total Bilirubin: 0.2 mg/dL (ref 0.2–1.2)
Total Protein: 7.3 g/dL (ref 6.1–8.1)

## 2022-02-25 LAB — HEMOGLOBIN A1C
Hgb A1c MFr Bld: 7 % of total Hgb — ABNORMAL HIGH (ref <5.7)
Mean Plasma Glucose: 154 mg/dL
eAG (mmol/L): 8.5 mmol/L

## 2022-02-25 LAB — T4, FREE: Free T4: 1.2 ng/dL (ref 0.8–1.8)

## 2022-02-25 LAB — TSH: TSH: 0.61 mIU/L

## 2022-02-25 MED ORDER — METFORMIN HCL 500 MG PO TABS: 500.0000 mg | ORAL_TABLET | Freq: Two times a day (BID) | ORAL | 11 refills | Status: DC

## 2022-02-25 NOTE — Addendum Note (Signed)
Addended by: Gwenevere Abbot on: 02/25/2022 07:39 AM   Modules accepted: Orders

## 2022-03-30 ENCOUNTER — Ambulatory Visit: Payer: Medicaid Other | Admitting: Medical

## 2022-04-01 ENCOUNTER — Ambulatory Visit: Payer: Medicaid Other | Admitting: Medical

## 2022-04-01 ENCOUNTER — Encounter: Payer: Self-pay | Admitting: Medical

## 2022-04-01 VITALS — BP 122/90 | HR 83 | Temp 98.3°F | Ht 61.0 in | Wt 162.6 lb

## 2022-04-01 DIAGNOSIS — R1013 Epigastric pain: Secondary | ICD-10-CM

## 2022-04-01 DIAGNOSIS — R110 Nausea: Secondary | ICD-10-CM

## 2022-04-01 MED ORDER — SITAGLIPTIN PHOSPHATE 25 MG PO TABS: 25.0000 mg | ORAL_TABLET | Freq: Every day | ORAL | 3 refills | Status: DC

## 2022-04-01 NOTE — Patient Instructions (Addendum)
Diabetes with last A1c of 7. Unfortunately you can't tolerate metformin. Recommend stopping metformin and getting cbc and cmp. Will rx januvia in place of meformin.   Hx of infertilty- Adriana Hahn is category b per our pharmacist(but relativley new med) so before you start want you to check pregnancy test and then can start januvia if negative. Also restart your contraceptive if test negative..  If diarrhea persists despite stopping metformin notify our office. In that event would order stool panel studies.  Follow up in May 27, 2021 or sooner  if needed.  I

## 2022-04-01 NOTE — Progress Notes (Signed)
Subjective:    Patient ID: Adriana Hahn, female    DOB: [birth year], 31 y.o.   MRN: [id number]  HPI  Pt in for follow up.  Pt is diabetic. 1 month ago she had A1c of 7.0. Pt stats 4 years ago was no metformin and had upset stomach, diarrhea and reflux. Recently I had written metformin but was not aware tried before and had side effects.   Recently when she restarted  metformin she states gi symptoms occurred. States got reflux, nausea and loose stools 3 times a day.   Lmp- March 14, 2022. Very light bleeding for 2 weeks. Pt has been told polycystic ovaries in past. Patient is on ocp to help regulate her menses.  Pt gyn rx'd her ocp. Pt stopped her ocp for 6 days when menses became light. Pt explains only on ocp for 2 months but her cycles are still light which was the case before about 50% of the time. Pregnancy test yesterday negative.    Review of Systems  Constitutional:  Negative for chills, fatigue and fever.  Respiratory:  Negative for cough, chest tightness, shortness of breath and wheezing.   Cardiovascular:  Negative for chest pain and palpitations.  Gastrointestinal:  Positive for diarrhea. Negative for abdominal distention, abdominal pain, anal bleeding and constipation.       This morning after metformin.  Musculoskeletal:  Negative for back pain and myalgias.  Skin:  Negative for rash.  Neurological:  Negative for dizziness, syncope, weakness, light-headedness and headaches.  Hematological:  Negative for adenopathy.  Psychiatric/Behavioral:  Negative for behavioral problems and confusion.     Past Medical History:  Diagnosis Date   Gestational diabetes    Migraines    PCOS (polycystic ovarian syndrome)      Social History   Socioeconomic History   Marital status: Married    Spouse name: Stephens November   Number of children: 0   Years of education: 13   Highest education level: High school graduate  Occupational History   Occupation: unemployed  Tobacco  Use   Smoking status: Never   Smokeless tobacco: Never  Vaping Use   Vaping Use: Never used  Substance and Sexual Activity   Alcohol use: Not Currently   Drug use: No   Sexual activity: Yes    Partners: Male    Birth control/protection: None  Other Topics Concern   Not on file  Social History Narrative   Not on file   Social Determinants of Health   Financial Resource Strain: Not on file  Food Insecurity: No Food Insecurity (11/27/2018)   Hunger Vital Sign    Worried About Running Out of Food in the Last Year: Never true    Ran Out of Food in the Last Year: Never true  Transportation Needs: No Transportation Needs (11/27/2018)   PRAPARE - Administrator, Civil Service (Medical): No    Lack of Transportation (Non-Medical): No  Physical Activity: Not on file  Stress: Not on file  Social Connections: Not on file  Intimate Partner Violence: Not on file    Past Surgical History:  Procedure Laterality Date   NO PAST SURGERIES      Family History  Problem Relation Age of Onset   Diabetes Mother     Allergies  Allergen Reactions   Cherry Anaphylaxis   Almond (Diagnostic) Itching   Apple    Apple Juice Itching    Red apples cause this more than other varieties of apples  Black Walnut Flavor Itching    Current Outpatient Medications on File Prior to Visit  Medication Sig Dispense Refill   ibuprofen (ADVIL) 600 MG tablet Take 1 tablet (600 mg total) by mouth every 6 (six) hours. 30 tablet 0   metFORMIN (GLUCOPHAGE) 500 MG tablet Take 1 tablet (500 mg total) by mouth 2 (two) times daily with a meal. 60 tablet 11   Multiple Vitamin (MULTIVITAMIN PO) Take by mouth.     Norethindrone Acetate-Ethinyl Estrad-FE (LOESTRIN 24 FE) 1-20 MG-MCG(24) tablet Take 1 tablet by mouth daily. 84 tablet 4   No current facility-administered medications on file prior to visit.    BP (!) 122/90 (BP Location: Left Arm, Patient Position: Sitting, Cuff Size: Normal)   Pulse 83    Temp 98.3 F (36.8 C) (Oral)   Ht 5\' 1"  (1.549 m)   Wt 162 lb 9.6 oz (73.8 kg)   SpO2 99%   Breastfeeding No   BMI 30.72 kg/m        Objective:   Physical Exam   General- No acute distress. Pleasant patient. Neck- Full range of motion, no jvd Lungs- Clear, even and unlabored. Heart- regular rate and rhythm. Neurologic- CNII- XII grossly intact.  Abdomen- soft, nt, nd, +bs no rebound or guarding.  Back- no cva tenderness.     Assessment & Plan:   Patient Instructions  Diabetes with last A1c of 7. Unfortunately you can't tolerate metformin. Recommend stopping metformin and getting cbc and cmp. Will rx januvia in place of meformin.   Hx of infertilty- Celesta Gentile is category b(but relativley new med) so before you start want you to check pregnancy test and then can start januvia if negative. Also restart your contraceptive if test negative..  If diarrhea persists despite stopping metformin notify our office. In that event would order stool panel studies.  Follow up in May 27, 2021 or sooner  if needed.  I   Mackie Pai, PA-C

## 2022-04-02 LAB — CBC WITH DIFFERENTIAL/PLATELET
Basophils Absolute: 0.1 10*3/uL (ref 0.0–0.1)
Basophils Relative: 1.3 % (ref 0.0–3.0)
Eosinophils Absolute: 0.1 10*3/uL (ref 0.0–0.7)
Eosinophils Relative: 1.6 % (ref 0.0–5.0)
HCT: 42 % (ref 36.0–46.0)
Hemoglobin: 14 g/dL (ref 12.0–15.0)
Lymphocytes Relative: 32 % (ref 12.0–46.0)
Lymphs Abs: 3 10*3/uL (ref 0.7–4.0)
MCHC: 33.3 g/dL (ref 30.0–36.0)
MCV: 87.9 fl (ref 78.0–100.0)
Monocytes Absolute: 0.5 10*3/uL (ref 0.1–1.0)
Monocytes Relative: 5.5 % (ref 3.0–12.0)
Neutro Abs: 5.6 10*3/uL (ref 1.4–7.7)
Neutrophils Relative %: 59.6 % (ref 43.0–77.0)
Platelets: 326 10*3/uL (ref 150.0–400.0)
RBC: 4.78 Mil/uL (ref 3.87–5.11)
RDW: 13.8 % (ref 11.5–15.5)
WBC: 9.4 10*3/uL (ref 4.0–10.5)

## 2022-04-02 LAB — COMPREHENSIVE METABOLIC PANEL
ALT: 27 U/L (ref 0–35)
AST: 22 U/L (ref 0–37)
Albumin: 4.5 g/dL (ref 3.5–5.2)
Alkaline Phosphatase: 65 U/L (ref 39–117)
BUN: 13 mg/dL (ref 6–23)
CO2: 26 mEq/L (ref 19–32)
Calcium: 9.8 mg/dL (ref 8.4–10.5)
Chloride: 103 mEq/L (ref 96–112)
Creatinine, Ser: 0.63 mg/dL (ref 0.40–1.20)
GFR: 118.21 mL/min (ref >60.00)
Glucose, Bld: 100 mg/dL — ABNORMAL HIGH (ref 70–99)
Potassium: 4.1 mEq/L (ref 3.5–5.1)
Sodium: 139 mEq/L (ref 135–145)
Total Bilirubin: 0.2 mg/dL (ref 0.2–1.2)
Total Protein: 7.2 g/dL (ref 6.0–8.3)

## 2022-04-02 LAB — LIPASE: Lipase: 27 U/L (ref 11.0–59.0)

## 2022-05-26 ENCOUNTER — Ambulatory Visit: Payer: Medicaid Other | Admitting: Medical

## 2022-05-26 VITALS — BP 131/78 | HR 92 | Temp 98.0°F | Resp 16 | Ht 62.0 in | Wt 165.6 lb

## 2022-05-26 DIAGNOSIS — R102 Pelvic and perineal pain: Secondary | ICD-10-CM

## 2022-05-26 DIAGNOSIS — E282 Polycystic ovarian syndrome: Secondary | ICD-10-CM | POA: Diagnosis not present

## 2022-05-26 DIAGNOSIS — J069 Acute upper respiratory infection, unspecified: Secondary | ICD-10-CM | POA: Diagnosis not present

## 2022-05-26 DIAGNOSIS — E119 Type 2 diabetes mellitus without complications: Secondary | ICD-10-CM | POA: Diagnosis not present

## 2022-05-26 DIAGNOSIS — N912 Amenorrhea, unspecified: Secondary | ICD-10-CM | POA: Diagnosis not present

## 2022-05-26 LAB — POCT URINE PREGNANCY: Preg Test, Ur: NEGATIVE

## 2022-05-26 LAB — POCT INFLUENZA A/B
Influenza A, POC: NEGATIVE
Influenza B, POC: NEGATIVE

## 2022-05-26 LAB — POC COVID19 BINAXNOW: SARS Coronavirus 2 Ag: NEGATIVE

## 2022-05-26 NOTE — Patient Instructions (Addendum)
Uri signs and symptoms. Covid and flu test negative. On exam no bacterial signs found. Rx flonase for nasal congestion and benzonatate for cough. If signs and symptoms worsen or change let us know. I think you will improve quickly as you daughter and husband did.  Diabetes- your A1c was 7.0. presently can use metormin 500 mg daily. Continue low sugar diet and exercise.   For irregular menstrual cycle we did urine pregancy test. Study cae back negative pregnant will order pelvic US to evaluate ovaries which hurt intermittently and know polycystic ovary disease.  But also recommend you get back in with regular gyn.  Follow up in 2 weeks or sooner if needed.

## 2022-05-26 NOTE — Progress Notes (Unsigned)
Subjective:    Patient ID: Adriana Hahn, female    DOB: 27-Dec-1990, 32 y.o.   MRN: [id number]  HPI Pt in with 3 days of mild fatigue, congestion, cough and mild ha. Mild/minimal body aches.   No covid test test.   Last week her child and husband were sick for about 3-5 upper respiratory symptoms.   Pt flu and covid test were negative.  Pt update me that she she decided to stop [place] and get back on metformin. She states [place] seemed to make her more hungry and felt like stomach inflamed/hurt. She states stopped [place] and started back on metformin.   Pt has been on metformin 500 mg twice daily. She states stomach feels a lot better compared to when was on januvia.  Pt last A1c  3 month 154. Pt recently has been eating healthier/less sugar. She is doing yoga.  Pt lmp- Mar 14, 2022 light spotting for 10 days. Same patter sept,oct and November.  After December no menses to date. In [place] Dx polycytic ovary disease. Pt has 66 year old child . She went thru 3 insemination tx to get pregnant. Intermittent adnexal pain at times.  Pt has seen gyn in the past gynecologist. Pt not sure what gyn plan is going forward in regards to ocp treatmnt.   Review of Systems  Constitutional:  Positive for fatigue. Negative for chills and fever.  HENT:  Positive for congestion.   Respiratory:  Positive for cough.   Cardiovascular:  Negative for chest pain and palpitations.  Gastrointestinal:  Negative for abdominal pain and blood in stool.  Genitourinary:        See hpi.  Musculoskeletal:  Positive for myalgias. Negative for arthralgias, back pain, neck pain and neck stiffness.  Skin:  Negative for rash.  Hematological:  Negative for adenopathy. Does not bruise/bleed easily.  Psychiatric/Behavioral:  Negative for behavioral problems and decreased concentration.     Past Medical History:  Diagnosis Date   Gestational diabetes    Migraines    PCOS (polycystic ovarian syndrome)      Social  History   Socioeconomic History   Marital status: Married    Spouse name: Ruthy Dick   Number of children: 0   Years of education: 13   Highest education level: High school graduate  Occupational History   Occupation: unemployed  Tobacco Use   Smoking status: Never   Smokeless tobacco: Never  Vaping Use   Vaping Use: Never used  Substance and Sexual Activity   Alcohol use: Not Currently   Drug use: No   Sexual activity: Yes    Partners: Male    Birth control/protection: None  Other Topics Concern   Not on file  Social History Narrative   Not on file   Social Determinants of Health   Financial Resource Strain: Not on file  Food Insecurity: No Food Insecurity (11/27/2018)   Hunger Vital Sign    Worried About Running Out of Food in the Last Year: Never true    Ran Out of Food in the Last Year: Never true  Transportation Needs: No Transportation Needs (11/27/2018)   PRAPARE - Hydrologist (Medical): No    Lack of Transportation (Non-Medical): No  Physical Activity: Not on file  Stress: Not on file  Social Connections: Not on file  Intimate Partner Violence: Not on file    Past Surgical History:  Procedure Laterality Date   NO PAST SURGERIES  Family History  Problem Relation Age of Onset   Diabetes Mother     Allergies  Allergen Reactions   Cherry Anaphylaxis   Almond (Diagnostic) Itching   Apple    Apple Juice Itching    Red apples cause this more than other varieties of apples   Black Walnut Flavor Itching    Current Outpatient Medications on File Prior to Visit  Medication Sig Dispense Refill   ibuprofen (ADVIL) 600 MG tablet Take 1 tablet (600 mg total) by mouth every 6 (six) hours. 30 tablet 0   metFORMIN (GLUCOPHAGE) 500 MG tablet Take 1 tablet (500 mg total) by mouth 2 (two) times daily with a meal. 60 tablet 11   Multiple Vitamin (MULTIVITAMIN PO) Take by mouth.     Norethindrone Acetate-Ethinyl Estrad-FE  (LOESTRIN 24 FE) 1-20 MG-MCG(24) tablet Take 1 tablet by mouth daily. 84 tablet 4   sitaGLIPtin (JANUVIA) 25 MG tablet Take 1 tablet (25 mg total) by mouth daily. 30 tablet 3   No current facility-administered medications on file prior to visit.    BP 131/78 (BP Location: Right Arm, Patient Position: Sitting, Cuff Size: Normal)   Pulse 92   Temp 98 F (36.7 C) (Oral)   Resp 16   Ht 5' 2"$  (1.575 m)   Wt 165 lb 9.6 oz (75.1 kg)   SpO2 96%   BMI 30.29 kg/m        Objective:   Physical Exam  General Mental Status- Alert. General Appearance- Not in acute distress.   Skin General: Color- Normal Color. Moisture- Normal Moisture.  Neck Carotid Arteries- Normal color. Moisture- Normal Moisture. No carotid bruits. No JVD.  Chest and Lung Exam Auscultation: Breath Sounds:-Normal.  Cardiovascular Auscultation:Rythm- Regular. Murmurs & Other Heart Sounds:Auscultation of the heart reveals- No Murmurs.  Abdomen Inspection:-Inspeection Normal. Palpation/Percussion:Note:No mass. Palpation and Percussion of the abdomen reveal- Non Tender, Non Distended + BS, no rebound or guarding.    Neurologic Cranial Nerve exam:- CN III-XII intact(No nystagmus), symmetric smile. Strength:- 5/5 equal and symmetric strength both upper and lower extremities.   Heent- no sinus pressure. Normal tms. Normal posterior pharynx    Assessment & Plan:   Patient Instructions  Samuel [place] signs and symptoms. Covid and flu test negative. On exam no bacterial signs found. Rx flonase for nasal congestion and benzonatate for cough. If signs and symptoms worsen or change let us know. I think you will improve quickly as you daughter and husband did.  Diabetes- your A1c was 7.0. presently can use metormin 500 mg daily. Continue low sugar diet and exercise.   For irregular menstrual cycle we did urine pregancy test. Study cae back negative pregnant will order pelvic US to evaluate ovaries which hurt intermittently and  know polycystic ovary disease.  But also recommend you get back in with regular gyn.  Follow up in 2 weeks or sooner if needed.   Mackie Pai, PA-C    Time spent with patient today was 40  minutes which consisted of chart rediew, discussing diagnoses, work up treatment and documentation. Answering pt varous question.

## 2022-05-31 ENCOUNTER — Ambulatory Visit (HOSPITAL_BASED_OUTPATIENT_CLINIC_OR_DEPARTMENT_OTHER)
Admission: RE | Admit: 2022-05-31 | Discharge: 2022-05-31 | Disposition: A | Discharge status: Home / Self Care | Payer: Medicaid Other | Source: Ambulatory Visit | Attending: Medical | Admitting: Medical

## 2022-05-31 DIAGNOSIS — R102 Pelvic and perineal pain: Secondary | ICD-10-CM | POA: Diagnosis present

## 2022-05-31 DIAGNOSIS — E282 Polycystic ovarian syndrome: Secondary | ICD-10-CM | POA: Insufficient documentation

## 2022-06-01 ENCOUNTER — Other Ambulatory Visit: Payer: Self-pay

## 2022-06-01 DIAGNOSIS — E119 Type 2 diabetes mellitus without complications: Secondary | ICD-10-CM

## 2022-06-01 MED ORDER — GLUCOSE BLOOD VI STRP: ORAL_STRIP | 12 refills | Status: DC

## 2022-06-02 ENCOUNTER — Telehealth: Payer: Self-pay | Admitting: Medical

## 2022-06-02 MED ORDER — ACCU-CHEK SOFTCLIX LANCETS MISC: 12 refills | Status: DC

## 2022-06-02 MED ORDER — ACCU-CHEK GUIDE W/DEVICE KIT: 1.0000 | PACK | Freq: Every day | 2 refills | Status: AC

## 2022-06-02 MED ORDER — ACCU-CHEK GUIDE VI STRP: ORAL_STRIP | 12 refills | Status: DC

## 2022-06-02 NOTE — Telephone Encounter (Signed)
Adriana Hahn with [facility] advised that Insurance is not covering the prodigy meter so it will cost her $64. She recommends a prescription for Accu Check Guide Me meter, lancets and strips to be sent in instead so pt's insurance will cover more. Please send prescription if okay with change.

## 2022-06-02 NOTE — Addendum Note (Signed)
Addended by: Jeronimo Greaves on: 06/02/2022 01:11 PM   Modules accepted: Orders

## 2022-06-02 NOTE — Telephone Encounter (Signed)
Rx sent 

## 2022-08-24 ENCOUNTER — Telehealth: Payer: Self-pay

## 2022-08-24 NOTE — Telephone Encounter (Signed)
FYI

## 2022-08-24 NOTE — Telephone Encounter (Signed)
-----   Message from [name] sent at 08/24/2022 12:10 PM EDT ----- Patient wants Adriana Hahn to know she is going to stop taking birth control RX. States it is not working plus she does not want to take birth control pills on a long term.

## 2022-11-16 ENCOUNTER — Ambulatory Visit (INDEPENDENT_AMBULATORY_CARE_PROVIDER_SITE_OTHER): Payer: Medicaid Other | Admitting: Dermatology

## 2022-11-16 VITALS — BP 133/90

## 2022-11-16 DIAGNOSIS — L300 Nummular dermatitis: Secondary | ICD-10-CM | POA: Diagnosis not present

## 2022-11-16 DIAGNOSIS — L649 Androgenic alopecia, unspecified: Secondary | ICD-10-CM

## 2022-11-16 MED ORDER — HYDROCORTISONE 2.5 % EX CREA: TOPICAL_CREAM | Freq: Two times a day (BID) | CUTANEOUS | 2 refills | Status: DC | PRN

## 2022-11-16 MED ORDER — MINOXIDIL 2.5 MG PO TABS: ORAL_TABLET | ORAL | 4 refills | Status: DC

## 2022-11-16 MED ORDER — SPIRONOLACTONE 100 MG PO TABS: 100.0000 mg | ORAL_TABLET | Freq: Every day | ORAL | 4 refills | Status: DC

## 2022-11-16 NOTE — Patient Instructions (Addendum)
Start Minoxidil 2.5 MG take 1/2 tablet by mouth daily. If tolerating ok after one month, may increase to full tablet daily. Doses of minoxidil for hair loss are considered 'low dose'. This is because the doses used for hair loss are much lower than the doses which are used for conditions such as high blood pressure (hypertension). The doses used for hypertension are 10-40mg  per day.  Side effects are uncommon at the low doses (up to 2.5 mg/day) used to treat hair loss. Potential side effects, more commonly seen at higher doses, include: Increase in hair growth (hypertrichosis) elsewhere on face and body Temporary hair shedding upon starting medication which may last up to 4 weeks Ankle swelling, fluid retention, rapid weight gain more than 5 pounds Low blood pressure and feeling lightheaded or dizzy when standing up quickly Fast or irregular heartbeat Headaches  Start Spironolactone 100 MG take 1/2 tablet by mouth every day. If tolerating ok after 1 month, increase to 1 full pill. Spironolactone can cause increased urination and cause blood pressure to decrease. Please watch for signs of lightheadedness and be cautious when changing position. It can sometimes cause breast tenderness or an irregular period in premenopausal women. It can also increase potassium. The increase in potassium usually is not a concern unless you are taking other medicines that also increase potassium, so please be sure your doctor knows all of the other medications you are taking. This medication should not be taken by pregnant women.  This medicine should also not be taken together with sulfa drugs like Bactrim (trimethoprim/sulfamethexazole).    Due to recent changes in healthcare laws, you may see results of your pathology and/or laboratory studies on MyChart before the doctors have had a chance to review them. We understand that in some cases there may be results that are confusing or concerning to you. Please understand  that not all results are received at the same time and often the doctors may need to interpret multiple results in order to provide you with the best plan of care or course of treatment. Therefore, we ask that you please give [place] 2 business days to thoroughly review all your results before contacting the office for clarification. Should we see a critical lab result, you will be contacted sooner.   If You Need Anything After Your Visit  If you have any questions or concerns for your doctor, please call our main line at [phone] and press option 4 to reach your doctor's medical assistant. If no one answers, please leave a voicemail as directed and we will return your call as soon as possible. Messages left after 4 pm will be answered the following business day.   You may also send [place] a message via MyChart. We typically respond to MyChart messages within 1-2 business days.  For prescription refills, please ask your pharmacy to contact our office. Our fax number is [phone].  If you have an urgent issue when the clinic is closed that cannot wait until the next business day, you can page your doctor at the number below.    Please note that while we do our best to be available for urgent issues outside of office hours, we are not available 24/7.   If you have an urgent issue and are unable to reach [place], you may choose to seek medical care at your doctor's office, retail clinic, urgent care center, or emergency room.  If you have a medical emergency, please immediately call 911 or go to the  emergency department.  Pager Numbers  - Dr. Gwen Pounds: [phone]  - Dr. Roseanne Reno: [phone]  - Dr. Katrinka Blazing: [phone]   In the event of inclement weather, please call our main line at [phone] for an update on the status of any delays or closures.  Dermatology Medication Tips: Please keep the boxes that topical medications come in in order to help keep track of the instructions about where  and how to use these. Pharmacies typically print the medication instructions only on the boxes and not directly on the medication tubes.   If your medication is too expensive, please contact our office at [phone] option 4 or send [place] a message through MyChart.   We are unable to tell what your co-pay for medications will be in advance as this is different depending on your insurance coverage. However, we may be able to find a substitute medication at lower cost or fill out paperwork to get insurance to cover a needed medication.   If a prior authorization is required to get your medication covered by your insurance company, please allow [place] 1-2 business days to complete this process.  Drug prices often vary depending on where the prescription is filled and some pharmacies may offer cheaper prices.  The website www.goodrx.com contains coupons for medications through different pharmacies. The prices here do not account for what the cost may be with help from insurance (it may be cheaper with your insurance), but the website can give you the price if you did not use any insurance.  - You can print the associated coupon and take it with your prescription to the pharmacy.  - You may also stop by our office during regular business hours and pick up a GoodRx coupon card.  - If you need your prescription sent electronically to a different pharmacy, notify our office through [facility] or by phone at [phone] option 4.     Si Usted Necesita Algo Despus de Su Visita  Tambin puede enviarnos un mensaje a travs de Clinical cytogeneticist. Por lo general respondemos a los mensajes de MyChart en el transcurso de 1 a 2 das hbiles.  Para renovar recetas, por favor pida a su farmacia que se ponga en contacto con nuestra oficina. Annie Sable de fax es [place] [phone].  Si tiene un asunto urgente cuando la clnica est cerrada y que no puede esperar hasta el siguiente da hbil, puede llamar/localizar a  su doctor(a) al nmero que aparece a continuacin.   Por favor, tenga en cuenta que aunque hacemos todo lo posible para estar disponibles para asuntos urgentes fuera del horario de [place], no estamos disponibles las 24 horas del da, los 7 [address]  [address] de la [place].   Si tiene un problema urgente y no puede comunicarse con nosotros, puede optar por buscar atencin mdica  en el consultorio de su doctor(a), en una clnica privada, en un centro de atencin urgente o en una sala de emergencias.  Si tiene Engineer, drilling, por favor llame inmediatamente al 911 o vaya a la sala de emergencias.  Nmeros de bper  - Dr. Gwen Pounds: [phone]  - Dra. Roseanne Reno: [phone]  - Dr. Katrinka Blazing: [phone]   En caso de inclemencias del tiempo, por favor llame a Lacy Duverney principal al [phone] para una actualizacin sobre el [place] de cualquier retraso o cierre.  Consejos para la medicacin en dermatologa: Por favor, guarde las cajas en las que vienen los medicamentos de uso tpico para ayudarle a seguir las instrucciones sobre dnde y cmo  usarlos. Las farmacias generalmente imprimen las instrucciones del medicamento slo en las cajas y no directamente en los tubos del [place].   Si su medicamento es muy caro, por favor, pngase en contacto con Rolm Gala llamando al [phone] y presione la opcin 4 o envenos un mensaje a travs de Clinical cytogeneticist.   No podemos decirle cul ser su copago por los medicamentos por adelantado ya que esto es diferente dependiendo de la cobertura de su seguro. Sin embargo, es posible que podamos encontrar un medicamento sustituto a Audiological scientist un formulario para que el seguro cubra el medicamento que se considera necesario.   Si se requiere una autorizacin previa para que su compaa de seguros [place] su medicamento, por favor permtanos de 1 a 2 das hbiles para completar [address]  Los precios de los medicamentos varan con frecuencia dependiendo  del Environmental consultant de dnde se surte la receta y alguna farmacias pueden ofrecer precios ms baratos.  El sitio web www.goodrx.com tiene cupones para medicamentos de Health and safety inspector. Los precios aqu no tienen en cuenta lo que podra costar con la ayuda del seguro (puede ser ms barato con su seguro), pero el sitio web puede darle el precio si no utiliz Tourist information centre manager.  - Puede imprimir el cupn correspondiente y llevarlo con su receta a la farmacia.  - Tambin puede pasar por nuestra oficina durante el horario de atencin regular y Education officer, museum una tarjeta de cupones de GoodRx.  - Si necesita que su receta se enve electrnicamente a una farmacia diferente, informe a nuestra oficina a travs de MyChart de [place] o por telfono llamando al [phone] y presione la opcin 4.

## 2022-11-16 NOTE — Progress Notes (Signed)
New Patient Visit   Subjective  Adriana Hahn is a 32 y.o. female who presents for the following: Spots on the skin that come and go. Some areas are white, some are pink. Larger areas are a little itchy, and she has used Vaseline on. She also has all over hair thinning of the scalp for 1 year. No new meds. No surgery or stress. No recent pregnancy.  Maternal grandmother has hair thinning. Father not balding- has hair. Was taking oral contraceptive but didn't help with hair loss, more hair fell out while taking. Has had ovarian cysts in the past (PCOS). TSH/T4 checked 11/23 was normal.  Patient accompanied by interpreter.  The following portions of the chart were reviewed this encounter and updated as appropriate: medications, allergies, medical history  Review of Systems:  No other skin or systemic complaints except as noted in HPI or Assessment and Plan.  Objective  Well appearing patient in no apparent distress; mood and affect are within normal limits.  A focused examination was performed of the following areas: Face, trunk, arms, legs  Relevant physical exam findings are noted in the Assessment and Plan.           Assessment & Plan    ANDROGENETIC ALOPECIA (FEMALE PATTERN HAIR LOSS) Exam: Diffuse thinning of the crown and widening of the midline part with retention of the frontal hairline, baseline photos today  Chronic and persistent condition with duration or expected duration over one year. Condition is symptomatic/ bothersome to patient. Not currently at goal.   Female Androgenic Alopecia is a chronic condition related to genetics and/or hormonal changes.  In women androgenetic alopecia is commonly associated with menopause but may occur any time after puberty.  It causes hair thinning primarily on the crown with widening of the part and temporal hairline recession.  Can use OTC Rogaine (minoxidil) 5% solution/foam as directed.  Oral treatments in female patients who  have no contraindication may include : - Low dose oral minoxidil 1.25 - 5mg  daily - Spironolactone 50 - 100mg  bid - Finasteride 2.5 - 5 mg daily Adjunctive therapies include: - Low Level Laser Light Therapy (LLLT) - Platelet-rich plasma injections (PRP) - Hair Transplants or scalp reduction   Treatment Plan: BP 133/90 Start Minoxidil 2.5 MG take 1 po every day dsp #30 5Rf. Start with 1/2 tablet and if tolerating ok after 1 month increase to 1 full tablet.  Doses of minoxidil for hair loss are considered 'low dose'. This is because the doses used for hair loss are much lower than the doses which are used for conditions such as high blood pressure (hypertension). The doses used for hypertension are 10-40mg  per day.  Side effects are uncommon at the low doses (up to 2.5 mg/day) used to treat hair loss. Potential side effects, more commonly seen at higher doses, include: Increase in hair growth (hypertrichosis) elsewhere on face and body Temporary hair shedding upon starting medication which may last up to 4 weeks Ankle swelling, fluid retention, rapid weight gain more than 5 pounds Low blood pressure and feeling lightheaded or dizzy when standing up quickly Fast or irregular heartbeat Headaches   Start Spironolactone 100 MG take 1 po every day. Patient will start with 1/2 tablet for 1 month, then increase to full pill if tolerating ok.  Spironolactone can cause increased urination and cause blood pressure to decrease. Please watch for signs of lightheadedness and be cautious when changing position. It can sometimes cause breast tenderness or an irregular  period in premenopausal women. It can also increase potassium. The increase in potassium usually is not a concern unless you are taking other medicines that also increase potassium, so please be sure your doctor knows all of the other medications you are taking. This medication should not be taken by pregnant women.  This medicine should also not  be taken together with sulfa drugs like Bactrim (trimethoprim/sulfamethexazole).    Long term medication management.  Patient is using long term (months to years) prescription medication  to control their dermatologic condition.  These medications require periodic monitoring to evaluate for efficacy and side effects and may require periodic laboratory monitoring.    Nummular Dermatitis (improved) vs  Pityriasis Rosea (resolved)  Exam: Pink macule of the right axilla, otherwise clear today.  Treatment Plan: Start 2.5 Hydrocortisone cream Apply to AA BID prn itch   Return in about 4 months (around 03/18/2023) for Alopecia, Dermatitis.  ICherlyn Labella, CMA, am acting as scribe for Willeen Niece, MD .   Documentation: I have reviewed the above documentation for accuracy and completeness, and I agree with the above.  Willeen Niece, MD

## 2022-12-07 ENCOUNTER — Ambulatory Visit: Payer: Medicaid Other | Admitting: Medical

## 2022-12-07 VITALS — BP 126/80 | HR 94 | Temp 98.2°F | Resp 18 | Ht 62.0 in | Wt 158.0 lb

## 2022-12-07 DIAGNOSIS — E119 Type 2 diabetes mellitus without complications: Secondary | ICD-10-CM

## 2022-12-07 DIAGNOSIS — T7840XA Allergy, unspecified, initial encounter: Secondary | ICD-10-CM

## 2022-12-07 DIAGNOSIS — Z7984 Long term (current) use of oral hypoglycemic drugs: Secondary | ICD-10-CM

## 2022-12-07 MED ORDER — METHYLPREDNISOLONE 4 MG PO TABS: ORAL_TABLET | ORAL | 0 refills | Status: DC

## 2022-12-07 MED ORDER — HYDROXYZINE PAMOATE 25 MG PO CAPS: ORAL_CAPSULE | ORAL | 0 refills | Status: DC

## 2022-12-07 NOTE — Progress Notes (Signed)
   Subjective:    Patient ID: Adriana Hahn, female    DOB: May 27, 1990, 32 y.o.   MRN: [id number]  HPI  Discussed the use of AI scribe software for clinical note transcription with the patient, who gave verbal consent to proceed.  History of Present Illness   The patient, with a history of dermatological issues, presents with a recent episode of generalized pruritus and facial swelling. The symptoms began after spending an hour outside, with no known exposure to allergens or irritants. The itching was generalized, excluding the face, but the following day, the patient woke up with facial swelling, particularly around the nose and forehead. The ears were also affected, with the left ear becoming notably enlarged and red. The patient denied any respiratory distress or tongue swelling.  To manage the itching, the patient initially took Claritin, which was ineffective and was followed by worsening symptoms the next day. She then took a pediatric dose of Benadryl, which provided some relief. By Sunday, she had purchased adult Benadryl, which she has been taking since. The facial swelling has since subsided, and the skin felt dry, for which she applied Vaseline.  The patient also reported a recent onset of pain behind the ear, which started on Saturday night. The left ear was itchy, and the right ear was slightly swollen but not to the extent of the left ear.  The patient has been on a medication prescribed by a dermatologist since August 14th for skin spots and hair loss. The medication includes a hydrocortisone cream for the skin spots lower ext  and oral Minoxidil for hair loss. I am unsure if the recent allergic reaction could be a side effect of these medications.       Review of Systems See hpi    Objective:   Physical Exam  General Mental Status- Alert. General Appearance- Not in acute distress.   Skin General: Color- Normal Color. Moisture- Normal Moisture.  Neck Carotid Arteries-  Normal color. Moisture- Normal Moisture. No carotid bruits. No JVD.  Chest and Lung Exam Auscultation: Breath Sounds:-Normal.  Cardiovascular Auscultation:Rythm- Regular. Murmurs & Other Heart Sounds:Auscultation of the heart reveals- No Murmurs.  Abdomen Inspection:-Inspeection Normal. Palpation/Percussion:Note:No mass. Palpation and Percussion of the abdomen reveal- Non Tender, Non Distended + BS, no rebound or guarding.   Neurologic Cranial Nerve exam:- CN III-XII intact(No nystagmus), symmetric smile. Strength:- 5/5 equal and symmetric strength both upper and lower extremities.       Assessment & Plan:   Patient Instructions  Allergic Reaction Presented with generalized pruritus and facial swelling after spending time outdoors. No respiratory symptoms or tongue swelling. Symptoms improved with Claritin and Benadryl. No known triggers or new medications. -Medrol taper dose 6 day taper(but not to start just yet. need to check your a1c and cmp).  -hydroxyzine 25 mg at night as needed itching.(Stop otc antishistamines) -Consider allergen testing to identify potential triggers.  Possible Drug Reaction Taking Minoxidil orally since August 14th for hair loss and skin spots. No initial reaction, but potential for delayed hypersensitivity reaction. -Discontinue Minoxidil temporarily and ask you call and get clarification from dermatologist. -Contact dermatologist's office to discuss potential drug reaction.   Diabetes-continue metformin. check cmp and a1c today.   Follow up date to be determined after lab review.

## 2022-12-07 NOTE — Patient Instructions (Addendum)
Allergic Reaction Presented with generalized pruritus and facial swelling after spending time outdoors. No respiratory symptoms or tongue swelling. Symptoms improved with Claritin and Benadryl. No known triggers or new medications. -Medrol taper dose 6 day taper(but not to start just yet. need to check your a1c and cmp).  -hydroxyzine 25 mg at night as needed itching.(Stop otc antishistamines) -Consider allergen testing to identify potential triggers.  Possible Drug Reaction Taking Minoxidil orally since August 14th for hair loss and skin spots. No initial reaction, but potential for delayed hypersensitivity reaction. -Discontinue Minoxidil temporarily and ask you call and get clarification from dermatologist. -Contact dermatologist's office to discuss potential drug reaction.   Diabetes-continue metformin. check cmp and a1c today.   Follow up date to be determined after lab review.

## 2022-12-08 LAB — COMPREHENSIVE METABOLIC PANEL
ALT: 22 U/L (ref 0–35)
AST: 16 U/L (ref 0–37)
Albumin: 4.4 g/dL (ref 3.5–5.2)
Alkaline Phosphatase: 67 U/L (ref 39–117)
BUN: 14 mg/dL (ref 6–23)
CO2: 28 meq/L (ref 19–32)
Calcium: 9.5 mg/dL (ref 8.4–10.5)
Chloride: 104 meq/L (ref 96–112)
Creatinine, Ser: 0.7 mg/dL (ref 0.40–1.20)
GFR: 114.69 mL/min (ref >60.00)
Glucose, Bld: 113 mg/dL — ABNORMAL HIGH (ref 70–99)
Potassium: 4.4 meq/L (ref 3.5–5.1)
Sodium: 138 meq/L (ref 135–145)
Total Bilirubin: 0.3 mg/dL (ref 0.2–1.2)
Total Protein: 7.3 g/dL (ref 6.0–8.3)

## 2022-12-08 LAB — HEMOGLOBIN A1C: Hgb A1c MFr Bld: 6.3 % (ref 4.6–6.5)

## 2022-12-10 ENCOUNTER — Telehealth: Payer: Self-pay | Admitting: Medical

## 2022-12-10 NOTE — Telephone Encounter (Signed)
Pt called stating saw provider on 12-07-2022 for allergy reaction,  pt mentioned provider (PCP Saguier) recommended to call her dermatologist to explain about her allergy reaction, it could of been from a medication that dermatologist gave her, pt states has been trying to call there office and no one has been able to help her or return her call since she speaks spanish, pt states that she understood that her PCP Ambulance person) mentioned if they don't get in contact with her to call our office to get help from our office to make her appt with the dermatologist or to get an advise from the dermatologist so it can be recommended to pt. ( Since pt only speaks spanish). Please advise. Pt Dermatology [facility])

## 2022-12-13 NOTE — Telephone Encounter (Signed)
Pt sees derm at [facility] , notes in epic

## 2022-12-13 NOTE — Telephone Encounter (Signed)
Dr. Roseanne Reno,  I had a question on mutual patient.  She had a recent moderate diffuse rash with itching and some swelling reported some on her body and face.  No particular cause found on review but she did note that she started oral minoxidil about 2 to 3 weeks prior to onset of the rash.  I am not familiar with oral minoxidil as to whether or not it can cause rash/allergic reaction.  I treated her with Medrol and advised her to hold the oral minoxidil until we get your opinion.  Thanks for your help with our patients.  Esperanza Richters, PA-C

## 2022-12-13 NOTE — Telephone Encounter (Signed)
Patient notified of provider's comments and recommendations. She reports her rash is almost clear and she will start the medications one at a time once she is cleared.

## 2023-01-28 ENCOUNTER — Ambulatory Visit: Payer: Medicaid Other | Admitting: Obstetrics & Gynecology

## 2023-01-31 ENCOUNTER — Ambulatory Visit: Payer: Medicaid Other | Admitting: Obstetrics and Gynecology

## 2023-03-07 ENCOUNTER — Ambulatory Visit: Payer: Medicaid Other | Admitting: Medical

## 2023-03-07 VITALS — BP 132/84 | HR 100 | Temp 98.2°F | Resp 18 | Ht 62.0 in | Wt 153.0 lb

## 2023-03-07 DIAGNOSIS — E119 Type 2 diabetes mellitus without complications: Secondary | ICD-10-CM

## 2023-03-07 DIAGNOSIS — R1013 Epigastric pain: Secondary | ICD-10-CM

## 2023-03-07 DIAGNOSIS — L659 Nonscarring hair loss, unspecified: Secondary | ICD-10-CM | POA: Diagnosis not present

## 2023-03-07 DIAGNOSIS — Z7984 Long term (current) use of oral hypoglycemic drugs: Secondary | ICD-10-CM | POA: Diagnosis not present

## 2023-03-07 NOTE — Patient Instructions (Signed)
Type 2 Diabetes Mellitus Last A1C was 6.3 (average glucose 125) 3 months ago. Currently on Metformin 500mg , alternating between once and twice daily due to gastrointestinal side effects. -Check A1C this week to assess control.(so will be past 90 day mark. Too early presenty) -Continue Metformin 500mg , alternating between once and twice daily.  Alopecia and Hyperpigmentation On Minoxidil and Spironolactone prescribed by dermatologist. Reports decreased hair loss but not significant regrowth. -Continue Minoxidil and Spironolactone as prescribed by dermatologist. -Follow up with dermatologist on 04/26/2022 or 04/27/2022.  Gastritis(gerd likely) Reports epigastric discomfort, particularly after consuming dairy, fatty, or spicy foods. Has been self-treating with Tums with some relief. -Order CBC, CMP, pancreatic enzymes, and H. Pylori breath test. future labs.  -Advise to avoid proton pump inhibitors, H2 blockers, and Tums until after H. Pylori breath test.   Follow up date to be determined after lab review.

## 2023-03-07 NOTE — Progress Notes (Signed)
Subjective:    Patient ID: Adriana Hahn, female    DOB: 1990-05-26, 32 y.o.   MRN: [id number]  HPI Discussed the use of AI scribe software for clinical note transcription with the patient, who gave verbal consent to proceed.  History of Present Illness   The patient, with a history of diabetes and dermatological issues, presents for a three-month diabetes check-up. She reports taking metformin 500mg  twice a day, but alternates between one and two doses daily due to gastrointestinal side effects, including loose stools and stomach discomfort. She also takes spironolactone and minoxidil, both prescribed by a dermatologist for hair loss and skin discoloration. She plans to continue these medications until her next dermatology appointment in January.  The patient has noticed a decrease in hair loss but not significant regrowth. She also reports a weight loss of 11 pounds, which she attributes to a decreased appetite due to suspected gastritis. She describes stomach discomfort, particularly after consuming dairy, fried/fatty foods and frequent belching. She has been self-managing these symptoms with Tums, which she finds helpful.  The patient also reports a long-standing, intermittent stomach discomfort that has been present for about ten years. This discomfort comes and goes, sometimes absent for a year, and other times occurring two to three times a year. She has noticed that consuming fatty or spicy foods exacerbates this discomfort. She has been managing these symptoms with Tums, which she finds helpful.       Lmp- Feb 22, 2023. Upcoming appt with gyn [id number].  Review of Systems  Constitutional:  Negative for chills, fatigue and fever.  Respiratory:  Negative for cough, chest tightness and wheezing.   Cardiovascular:  Negative for chest pain and palpitations.  Gastrointestinal:  Positive for abdominal pain.       Epigastric pain. See hpi.  Genitourinary:  Negative for dysuria and urgency.   Musculoskeletal:  Negative for back pain and myalgias.  Neurological:  Negative for dizziness and light-headedness.  Hematological:  Negative for adenopathy.  Psychiatric/Behavioral:  Negative for behavioral problems and dysphoric mood.     Past Medical History:  Diagnosis Date   Gestational diabetes    Migraines    PCOS (polycystic ovarian syndrome)      Social History   Socioeconomic History   Marital status: Married    Spouse name: Stephens November   Number of children: 0   Years of education: 13   Highest education level: 8th grade  Occupational History   Occupation: unemployed  Tobacco Use   Smoking status: Never   Smokeless tobacco: Never  Vaping Use   Vaping status: Never Used  Substance and Sexual Activity   Alcohol use: Not Currently   Drug use: No   Sexual activity: Yes    Partners: Male    Birth control/protection: None  Other Topics Concern   Not on file  Social History Narrative   Not on file   Social Determinants of Health   Financial Resource Strain: Patient Declined (03/04/2023)   Overall Financial Resource Strain (CARDIA)    Difficulty of Paying Living Expenses: Patient declined  Food Insecurity: Unknown (03/04/2023)   Hunger Vital Sign    Worried About Running Out of Food in the Last Year: Patient declined    Ran Out of Food in the Last Year: Never true  Transportation Needs: No Transportation Needs (03/04/2023)   PRAPARE - Administrator, Civil Service (Medical): No    Lack of Transportation (Non-Medical): No  Physical Activity: Insufficiently Active (03/04/2023)  Exercise Vital Sign    Days of Exercise per Week: 4 days    Minutes of Exercise per Session: 30 min  Stress: No Stress Concern Present (03/04/2023)   [employer] of Occupational Health - Occupational Stress Questionnaire    Feeling of Stress : Not at all  Social Connections: Unknown (03/04/2023)   Social Connection and Isolation Panel [NHANES]    Frequency  of Communication with Friends and Family: Three times a week    Frequency of Social Gatherings with Friends and Family: Patient declined    Attends Religious Services: Patient declined    Database administrator or Organizations: No    Attends Engineer, structural: Not on file    Marital Status: Married  Catering manager Violence: Not on file    Past Surgical History:  Procedure Laterality Date   NO PAST SURGERIES      Family History  Problem Relation Age of Onset   Diabetes Mother     Allergies  Allergen Reactions   Cherry Anaphylaxis   Almond (Diagnostic) Itching   Apple    Apple Juice Itching    Red apples cause this more than other varieties of apples   Black Walnut Flavor Itching    Current Outpatient Medications on File Prior to Visit  Medication Sig Dispense Refill   Accu-Chek Softclix Lancets lancets CHECK blood sugar BID dx: E11.9 100 each 12   Blood Glucose Monitoring Suppl (ACCU-CHEK GUIDE) w/Device KIT 1 kit by Does not apply route daily. 1 kit 2   glucose blood (ACCU-CHEK GUIDE) test strip Check blood sugar BID dx:E11.9 100 each 12   hydrocortisone 2.5 % cream Apply topically 2 (two) times daily as needed (Rash). 30 g 2   hydrOXYzine (VISTARIL) 25 MG capsule 1 tab po q hs prn itching 7 capsule 0   ibuprofen (ADVIL) 600 MG tablet Take 1 tablet (600 mg total) by mouth every 6 (six) hours. 30 tablet 0   metFORMIN (GLUCOPHAGE) 500 MG tablet Take 1 tablet (500 mg total) by mouth 2 (two) times daily with a meal. 60 tablet 11   methylPREDNISolone (MEDROL) 4 MG tablet Standard 6 day taper dose 21 tablet 0   minoxidil (LONITEN) 2.5 MG tablet Take 1 tablet by mouth every day. 30 tablet 4   Multiple Vitamin (MULTIVITAMIN PO) Take by mouth.     spironolactone (ALDACTONE) 100 MG tablet Take 1 tablet (100 mg total) by mouth daily. 30 tablet 4   No current facility-administered medications on file prior to visit.    BP 132/84   Pulse 100   Temp 98.2 F (36.8 C)    Resp 18   Ht 5\' 2"  (1.575 m)   Wt 153 lb (69.4 kg)   SpO2 100%   BMI 27.98 kg/m        Objective:   Physical Exam  General Mental Status- Alert. General Appearance- Not in acute distress.   Skin General: Color- Normal Color. Moisture- Normal Moisture.  Neck Carotid Arteries- Normal color. Moisture- Normal Moisture. No carotid bruits. No JVD.  Chest and Lung Exam Auscultation: Breath Sounds:-CTA  Cardiovascular Auscultation:Rythm- RRR Murmurs & Other Heart Sounds:Auscultation of the heart reveals- No Murmurs.  Abdomen Inspection:-Inspeection Normal. Palpation/Percussion:Note:No mass. Palpation and Percussion of the abdomen reveal- Non Tender, Non Distended + BS, no rebound or guarding.  Neurologic Cranial Nerve exam:- CN III-XII intact(No nystagmus), symmetric smile. Strength:- 5/5 equal and symmetric strength both upper and lower extremities.   Back- no cva tenderness.  Assessment & Plan:   Assessment and Plan    Type 2 Diabetes Mellitus Last A1C was 6.3 (average glucose 125) 3 months ago. Currently on Metformin 500mg , alternating between once and twice daily due to gastrointestinal side effects. -Check A1C this week to assess control.(so will be past 90 day mark. Too early presenty) -Continue Metformin 500mg , alternating between once and twice daily.  Alopecia and Hyperpigmentation On Minoxidil and Spironolactone prescribed by dermatologist. Reports decreased hair loss but not significant regrowth. -Continue Minoxidil and Spironolactone as prescribed by dermatologist. -Follow up with dermatologist on 04/26/2022 or 04/27/2022.  Gastritis(gerd likely) Reports epigastric discomfort, particularly after consuming dairy, fatty, or spicy foods. Has been self-treating with Tums with some relief. -Order CBC, CMP, pancreatic enzymes, and H. Pylori breath test. future labs.  -Advise to avoid proton pump inhibitors, H2 blockers, and Tums until after H. Pylori breath  test.   Follow up date to be determined after lab review.        Esperanza Richters, PA-C

## 2023-03-09 ENCOUNTER — Other Ambulatory Visit (INDEPENDENT_AMBULATORY_CARE_PROVIDER_SITE_OTHER): Payer: Medicaid Other

## 2023-03-09 DIAGNOSIS — R1013 Epigastric pain: Secondary | ICD-10-CM

## 2023-03-09 DIAGNOSIS — E119 Type 2 diabetes mellitus without complications: Secondary | ICD-10-CM | POA: Diagnosis not present

## 2023-03-09 LAB — CBC WITH DIFFERENTIAL/PLATELET
Basophils Absolute: 0.1 10*3/uL (ref 0.0–0.1)
Basophils Relative: 0.8 % (ref 0.0–3.0)
Eosinophils Absolute: 0.1 10*3/uL (ref 0.0–0.7)
Eosinophils Relative: 1.6 % (ref 0.0–5.0)
HCT: 40 % (ref 36.0–46.0)
Hemoglobin: 13.3 g/dL (ref 12.0–15.0)
Lymphocytes Relative: 29.1 % (ref 12.0–46.0)
Lymphs Abs: 2 10*3/uL (ref 0.7–4.0)
MCHC: 33.1 g/dL (ref 30.0–36.0)
MCV: 89.3 fL (ref 78.0–100.0)
Monocytes Absolute: 0.3 10*3/uL (ref 0.1–1.0)
Monocytes Relative: 4.8 % (ref 3.0–12.0)
Neutro Abs: 4.5 10*3/uL (ref 1.4–7.7)
Neutrophils Relative %: 63.7 % (ref 43.0–77.0)
Platelets: 299 10*3/uL (ref 150.0–400.0)
RBC: 4.48 Mil/uL (ref 3.87–5.11)
RDW: 14 % (ref 11.5–15.5)
WBC: 7 10*3/uL (ref 4.0–10.5)

## 2023-03-09 LAB — COMPREHENSIVE METABOLIC PANEL
ALT: 16 U/L (ref 0–35)
AST: 14 U/L (ref 0–37)
Albumin: 4.5 g/dL (ref 3.5–5.2)
Alkaline Phosphatase: 52 U/L (ref 39–117)
BUN: 16 mg/dL (ref 6–23)
CO2: 27 meq/L (ref 19–32)
Calcium: 9.6 mg/dL (ref 8.4–10.5)
Chloride: 104 meq/L (ref 96–112)
Creatinine, Ser: 0.67 mg/dL (ref 0.40–1.20)
GFR: 115.71 mL/min (ref >60.00)
Glucose, Bld: 123 mg/dL — ABNORMAL HIGH (ref 70–99)
Potassium: 4.4 meq/L (ref 3.5–5.1)
Sodium: 138 meq/L (ref 135–145)
Total Bilirubin: 0.5 mg/dL (ref 0.2–1.2)
Total Protein: 6.9 g/dL (ref 6.0–8.3)

## 2023-03-09 LAB — HEMOGLOBIN A1C: Hgb A1c MFr Bld: 6.6 % — ABNORMAL HIGH (ref 4.6–6.5)

## 2023-03-09 LAB — LIPASE: Lipase: 28 U/L (ref 11.0–59.0)

## 2023-03-14 LAB — H. PYLORI BREATH TEST: H. pylori Breath Test: NOT DETECTED

## 2023-03-16 ENCOUNTER — Ambulatory Visit: Payer: Medicaid Other | Admitting: Obstetrics and Gynecology

## 2023-04-20 ENCOUNTER — Ambulatory Visit (INDEPENDENT_AMBULATORY_CARE_PROVIDER_SITE_OTHER): Payer: Medicaid Other | Admitting: Obstetrics and Gynecology

## 2023-04-20 ENCOUNTER — Encounter: Payer: Self-pay | Admitting: Obstetrics and Gynecology

## 2023-04-20 ENCOUNTER — Other Ambulatory Visit (HOSPITAL_COMMUNITY)
Admission: RE | Admit: 2023-04-20 | Discharge: 2023-04-20 | Disposition: A | Discharge status: Home / Self Care | Payer: Medicaid Other | Source: Ambulatory Visit | Attending: Obstetrics and Gynecology | Admitting: Obstetrics and Gynecology

## 2023-04-20 VITALS — BP 118/80 | HR 95 | Ht 60.75 in | Wt 154.0 lb

## 2023-04-20 DIAGNOSIS — N938 Other specified abnormal uterine and vaginal bleeding: Secondary | ICD-10-CM

## 2023-04-20 DIAGNOSIS — E282 Polycystic ovarian syndrome: Secondary | ICD-10-CM | POA: Diagnosis not present

## 2023-04-20 DIAGNOSIS — Z01419 Encounter for gynecological examination (general) (routine) without abnormal findings: Secondary | ICD-10-CM

## 2023-04-20 DIAGNOSIS — Z23 Encounter for immunization: Secondary | ICD-10-CM | POA: Diagnosis not present

## 2023-04-20 NOTE — Progress Notes (Signed)
 33 y.o. y.o. female here for annual exam. Patient's last menstrual period was 04/11/2023 (exact date). Period Pattern: (!) Irregular Menstrual Flow:  (varies) Menstrual Control: Maxi pad Dysmenorrhea: (!) Severe Dysmenorrhea Symptoms: Cramping, Other (Comment) (migraines)  G1P1L1 Married.  Daughter is 76 y/o Has been trying to conceive with same partner for a year with no success. Would like to continue naturally. Periods q month until Oct and Nov she had two periods in each month.  In October she bled on the 1st through the 10th and the on the 20th starting bleeding again. She reports heavy bleeding, pain and some clots.    HPI:   H/O conception on Metformin /Clomid /IUI with Dx of PCOS at that time, SVD, induction for Pre-Eclampsia.  Not using contraception currently.  Not exercising regularly. Healthy nutrition.   Body mass index is 29.34 kg/m. Last BMI 31 MMG: never   Show images for US  PELVIC COMPLETE WITH TRANSVAGINAL Study Result  Narrative & Impression  CLINICAL DATA:  Right lower quadrant pain intermittently. History of PCOS.   EXAM: TRANSABDOMINAL AND TRANSVAGINAL ULTRASOUND OF PELVIS   TECHNIQUE: Both transabdominal and transvaginal ultrasound examinations of the pelvis were performed. Transabdominal technique was performed for global imaging of the pelvis including uterus, ovaries, adnexal regions, and pelvic cul-de-sac. It was necessary to proceed with endovaginal exam following the transabdominal exam to visualize the endometrium and ovaries.   COMPARISON:  None Available.   FINDINGS: Uterus   Measurements: 8.6 x 4.6 x 5.6 cm = volume: 115 mL. No fibroids or other mass visualized.   Endometrium   Thickness: 10.6 mm.  No focal abnormality visualized.   Right ovary   Measurements: 3.8 x 2.6 x 2.6 cm = volume: 13.4 mL. Normal appearance/no adnexal mass.   Left ovary   Measurements: 3.2 x 2.4 x 2.1 cm = volume: 8.5 mL. Normal appearance/no  adnexal mass.   Other findings   No abnormal free fluid.   IMPRESSION: Normal study.          04/20/2023    1:48 PM 05/26/2022    3:14 PM 02/24/2022    3:23 PM  Depression screen PHQ 2/9  Decreased Interest 0 0 0  Down, Depressed, Hopeless 0 0 0  PHQ - 2 Score 0 0 0  Altered sleeping  0   Tired, decreased energy  0   Change in appetite  0   Feeling bad or failure about yourself   0   Trouble concentrating  0   Moving slowly or fidgety/restless  0   Suicidal thoughts  0   PHQ-9 Score  0   Difficult doing work/chores  Not difficult at all     Blood pressure 118/80, pulse 95, height 5' 0.75" (1.543 m), weight 154 lb (69.9 kg), last menstrual period 04/11/2023, SpO2 98%.     Component Value Date/Time   DIAGPAP  01/20/2022 1043    - Negative for intraepithelial lesion or malignancy (NILM)   DIAGPAP  01/08/2021 0907    - Negative for Intraepithelial Lesions or Malignancy (NILM)   DIAGPAP - Benign reactive/reparative changes 01/08/2021 0907   ADEQPAP  01/20/2022 1043    Satisfactory for evaluation; transformation zone component PRESENT.   ADEQPAP  01/08/2021 0907    Satisfactory for evaluation; transformation zone component PRESENT.    GYN HISTORY:    Component Value Date/Time   DIAGPAP  01/20/2022 1043    - Negative for intraepithelial lesion or malignancy (NILM)   DIAGPAP  01/08/2021 1610    -  Negative for Intraepithelial Lesions or Malignancy (NILM)   DIAGPAP - Benign reactive/reparative changes 01/08/2021 0907   ADEQPAP  01/20/2022 1043    Satisfactory for evaluation; transformation zone component PRESENT.   ADEQPAP  01/08/2021 0907    Satisfactory for evaluation; transformation zone component PRESENT.    OB History  Gravida Para Term Preterm AB Living  1 1 1  0 0 1  SAB IAB Ectopic Multiple Live Births  0 0 0 0 1    # Outcome Date GA Lbr Len/2nd Weight Sex Type Anes PTL Lv  1 Term 05/29/19 [redacted]w[redacted]d 10:19 / 00:17 6 lb 14.8 oz (3.14 kg) F Vag-Spont EPI  LIV     Past Medical History:  Diagnosis Date   Gestational diabetes    Migraines    PCOS (polycystic ovarian syndrome)     Past Surgical History:  Procedure Laterality Date   NO PAST SURGERIES      Current Outpatient Medications on File Prior to Visit  Medication Sig Dispense Refill   Accu-Chek Softclix Lancets lancets CHECK blood sugar BID dx: E11.9 100 each 12   Blood Glucose Monitoring Suppl (ACCU-CHEK GUIDE) w/Device KIT 1 kit by Does not apply route daily. 1 kit 2   glucose blood (ACCU-CHEK GUIDE) test strip Check blood sugar BID dx:E11.9 100 each 12   hydrocortisone  2.5 % cream Apply topically 2 (two) times daily as needed (Rash). 30 g 2   hydrOXYzine  (VISTARIL ) 25 MG capsule 1 tab po q hs prn itching 7 capsule 0   ibuprofen  (ADVIL ) 600 MG tablet Take 1 tablet (600 mg total) by mouth every 6 (six) hours. 30 tablet 0   metFORMIN  (GLUCOPHAGE ) 500 MG tablet Take 1 tablet (500 mg total) by mouth 2 (two) times daily with a meal. 60 tablet 11   minoxidil  (LONITEN ) 2.5 MG tablet Take 1 tablet by mouth every day. 30 tablet 4   spironolactone  (ALDACTONE ) 100 MG tablet Take 1 tablet (100 mg total) by mouth daily. 30 tablet 4   No current facility-administered medications on file prior to visit.    Social History   Socioeconomic History   Marital status: Married    Spouse name: Paris Bolds   Number of children: 0   Years of education: 13   Highest education level: 8th grade  Occupational History   Occupation: unemployed  Tobacco Use   Smoking status: Never   Smokeless tobacco: Never  Vaping Use   Vaping status: Never Used  Substance and Sexual Activity   Alcohol use: Not Currently   Drug use: No   Sexual activity: Yes    Partners: Male    Birth control/protection: None  Other Topics Concern   Not on file  Social History Narrative   Not on file   Social Drivers of Health   Financial Resource Strain: Patient Declined (03/04/2023)   Overall Financial Resource  Strain (CARDIA)    Difficulty of Paying Living Expenses: Patient declined  Food Insecurity: Unknown (03/04/2023)   Hunger Vital Sign    Worried About Running Out of Food in the Last Year: Patient declined    Ran Out of Food in the Last Year: Never true  Transportation Needs: No Transportation Needs (03/04/2023)   PRAPARE - Administrator, Civil Service (Medical): No    Lack of Transportation (Non-Medical): No  Physical Activity: Insufficiently Active (03/04/2023)   Exercise Vital Sign    Days of Exercise per Week: 4 days    Minutes of Exercise per Session:  30 min  Stress: No Stress Concern Present (03/04/2023)   [employer] of Occupational Health - Occupational Stress Questionnaire    Feeling of Stress : Not at all  Social Connections: Unknown (03/04/2023)   Social Connection and Isolation Panel [NHANES]    Frequency of Communication with Friends and Family: Three times a week    Frequency of Social Gatherings with Friends and Family: Patient declined    Attends Religious Services: Patient declined    Database administrator or Organizations: No    Attends Engineer, structural: Not on file    Marital Status: Married  Catering manager Violence: Not on file    Family History  Problem Relation Age of Onset   Diabetes Mother      Allergies  Allergen Reactions   Cherry Anaphylaxis   Almond (Diagnostic) Itching   Apple    Apple Juice Itching    Red apples cause this more than other varieties of apples   Black [employer] (Non-Screening) Itching      Patient's last menstrual period was Patient's last menstrual period was 04/11/2023 (exact date)..            Review of Systems Alls systems reviewed and are negative.     Physical Exam Constitutional:      Appearance: Normal appearance.  Genitourinary:     Vulva and urethral meatus normal.     No lesions in the vagina.     Right Labia: No rash, lesions or skin changes.    Left  Labia: No lesions, skin changes or rash.    No vaginal discharge or tenderness.     No vaginal prolapse present.    No vaginal atrophy present.     Right Adnexa: not tender, not palpable and no mass present.    Left Adnexa: not tender, not palpable and no mass present.    No cervical motion tenderness or discharge.     Uterus is not enlarged, tender or irregular.  Breasts:    Right: Normal.     Left: Normal.  HENT:     Head: Normocephalic.  Neck:     Thyroid: No thyroid mass, thyromegaly or thyroid tenderness.  Cardiovascular:     Rate and Rhythm: Normal rate and regular rhythm.     Heart sounds: Normal heart sounds, S1 normal and S2 normal.  Pulmonary:     Effort: Pulmonary effort is normal.     Breath sounds: Normal breath sounds and air entry.  Abdominal:     General: There is no distension.     Palpations: Abdomen is soft. There is no mass.     Tenderness: There is no abdominal tenderness. There is no guarding or rebound.  Musculoskeletal:        General: Normal range of motion.     Cervical back: Full passive range of motion without pain, normal range of motion and neck supple. No tenderness.     Right lower leg: No edema.     Left lower leg: No edema.  Neurological:     Mental Status: She is alert.  Skin:    General: Skin is warm.  Psychiatric:        Mood and Affect: Mood normal.        Behavior: Behavior normal.        Thought Content: Thought content normal.  Vitals and nursing note reviewed. Exam conducted with a chaperone present.       A:  Well Woman GYN exam, PCO, DUB                             P:        Pap smear collected today Encouraged annual mammogram screening Colon cancer screening not indicated DXA not indicated Labs and immunizations ordered today Encouraged continued PNV Counseled on importance for PUS and EMB with h/o PCO and risk for endometrial cancer with this.  Discussed birth control when not trying to conceive.  To return  for TV US  and EMB Encouraged healthy lifestyle practices   No follow-ups on file.  Reinaldo Caras

## 2023-04-21 LAB — LIPID PANEL
Cholesterol: 144 mg/dL (ref <200)
HDL: 45 mg/dL — ABNORMAL LOW (ref >=50)
LDL Cholesterol (Calc): 85 mg/dL
Non-HDL Cholesterol (Calc): 99 mg/dL (ref <130)
Total CHOL/HDL Ratio: 3.2 (calc) (ref <5.0)
Triglycerides: 68 mg/dL (ref <150)

## 2023-04-21 LAB — HEMOGLOBIN A1C
Hgb A1c MFr Bld: 6.4 %{Hb} — ABNORMAL HIGH (ref <5.7)
Mean Plasma Glucose: 137 mg/dL
eAG (mmol/L): 7.6 mmol/L

## 2023-04-21 LAB — CBC
HCT: 35.7 % (ref 35.0–45.0)
Hemoglobin: 11.9 g/dL (ref 11.7–15.5)
MCH: 29.2 pg (ref 27.0–33.0)
MCHC: 33.3 g/dL (ref 32.0–36.0)
MCV: 87.5 fL (ref 80.0–100.0)
MPV: 11.5 fL (ref 7.5–12.5)
Platelets: 340 10*3/uL (ref 140–400)
RBC: 4.08 10*6/uL (ref 3.80–5.10)
RDW: 13.3 % (ref 11.0–15.0)
WBC: 6.4 10*3/uL (ref 3.8–10.8)

## 2023-04-21 LAB — COMPREHENSIVE METABOLIC PANEL
AG Ratio: 1.7 (calc) (ref 1.0–2.5)
ALT: 15 U/L (ref 6–29)
AST: 13 U/L (ref 10–30)
Albumin: 4.2 g/dL (ref 3.6–5.1)
Alkaline phosphatase (APISO): 66 U/L (ref 31–125)
BUN: 13 mg/dL (ref 7–25)
CO2: 27 mmol/L (ref 20–32)
Calcium: 9.1 mg/dL (ref 8.6–10.2)
Chloride: 106 mmol/L (ref 98–110)
Creat: 0.55 mg/dL (ref 0.50–0.97)
Globulin: 2.5 g/dL (ref 1.9–3.7)
Glucose, Bld: 111 mg/dL — ABNORMAL HIGH (ref 65–99)
Potassium: 4.3 mmol/L (ref 3.5–5.3)
Sodium: 139 mmol/L (ref 135–146)
Total Bilirubin: 0.3 mg/dL (ref 0.2–1.2)
Total Protein: 6.7 g/dL (ref 6.1–8.1)

## 2023-04-21 LAB — HEPATITIS C ANTIBODY: Hepatitis C Ab: NONREACTIVE

## 2023-04-21 LAB — HIV ANTIBODY (ROUTINE TESTING W REFLEX): HIV 1&2 Ab, 4th Generation: NONREACTIVE

## 2023-04-21 LAB — SURESWAB® ADVANCED VAGINITIS PLUS,TMA
C. trachomatis RNA, TMA: NOT DETECTED
CANDIDA SPECIES: NOT DETECTED
Candida glabrata: NOT DETECTED
N. gonorrhoeae RNA, TMA: NOT DETECTED
SURESWAB(R) ADV BACTERIAL VAGINOSIS(BV),TMA: NEGATIVE
TRICHOMONAS VAGINALIS (TV),TMA: NOT DETECTED

## 2023-04-21 LAB — VITAMIN D 25 HYDROXY (VIT D DEFICIENCY, FRACTURES): Vit D, 25-Hydroxy: 23 ng/mL — ABNORMAL LOW (ref 30–100)

## 2023-04-21 LAB — TSH: TSH: 0.65 m[IU]/L

## 2023-04-21 LAB — HEPATITIS B SURFACE ANTIGEN: Hepatitis B Surface Ag: NONREACTIVE

## 2023-04-21 LAB — RPR: RPR Ser Ql: NONREACTIVE

## 2023-04-22 LAB — CYTOLOGY - PAP
Adequacy: ABSENT
Comment: NEGATIVE
Diagnosis: NEGATIVE
High risk HPV: NEGATIVE

## 2023-04-25 ENCOUNTER — Encounter: Payer: Self-pay | Admitting: Obstetrics and Gynecology

## 2023-04-26 ENCOUNTER — Ambulatory Visit (INDEPENDENT_AMBULATORY_CARE_PROVIDER_SITE_OTHER): Payer: Medicaid Other | Admitting: Dermatology

## 2023-04-26 DIAGNOSIS — L300 Nummular dermatitis: Secondary | ICD-10-CM | POA: Diagnosis not present

## 2023-04-26 DIAGNOSIS — Z79899 Other long term (current) drug therapy: Secondary | ICD-10-CM | POA: Diagnosis not present

## 2023-04-26 DIAGNOSIS — L649 Androgenic alopecia, unspecified: Secondary | ICD-10-CM | POA: Diagnosis not present

## 2023-04-26 MED ORDER — HYDROCORTISONE 2.5 % EX OINT: TOPICAL_OINTMENT | CUTANEOUS | 3 refills | Status: AC

## 2023-04-26 MED ORDER — MINOXIDIL 2.5 MG PO TABS: ORAL_TABLET | ORAL | 4 refills | Status: DC

## 2023-04-26 MED ORDER — SPIRONOLACTONE 100 MG PO TABS: 100.0000 mg | ORAL_TABLET | Freq: Every day | ORAL | 4 refills | Status: DC

## 2023-04-26 NOTE — Patient Instructions (Addendum)
Start Vitamin D 5000 international units daily (or 2000 international units 2 pills daily).  Gentle Skin Care Guide  1. Bathe no more than once a day.  2. Avoid bathing in hot water  3. Use a mild soap like Dove, Vanicream, Cetaphil, CeraVe. Can use Lever 2000 or Cetaphil antibacterial soap  4. Use soap only where you need it. On most days, use it under your arms, between your legs, and on your feet. Let the water rinse other areas unless visibly dirty.  5. When you get out of the bath/shower, use a towel to gently blot your skin dry, don't rub it.  6. While your skin is still a little damp, apply a moisturizing cream such as Vanicream, CeraVe, Cetaphil, Eucerin, Sarna lotion or plain Vaseline Jelly. For hands apply Neutrogena [place] Hand Cream or Excipial Hand Cream.  7. Reapply moisturizer any time you start to itch or feel dry.  8. Sometimes using free and clear laundry detergents can be helpful. Fabric softener sheets should be avoided. Downy Free & Gentle liquid, or any liquid fabric softener that is free of dyes and perfumes, it acceptable to use  9. If your doctor has given you prescription creams you may apply moisturizers over them    Due to recent changes in healthcare laws, you may see results of your pathology and/or laboratory studies on MyChart before the doctors have had a chance to review them. We understand that in some cases there may be results that are confusing or concerning to you. Please understand that not all results are received at the same time and often the doctors may need to interpret multiple results in order to provide you with the best plan of care or course of treatment. Therefore, we ask that you please give [place] 2 business days to thoroughly review all your results before contacting the office for clarification. Should we see a critical lab result, you will be contacted sooner.   If You Need Anything After Your Visit  If you have any questions or  concerns for your doctor, please call our main line at [phone] and press option 4 to reach your doctor's medical assistant. If no one answers, please leave a voicemail as directed and we will return your call as soon as possible. Messages left after 4 pm will be answered the following business day.   You may also send [place] a message via MyChart. We typically respond to MyChart messages within 1-2 business days.  For prescription refills, please ask your pharmacy to contact our office. Our fax number is [phone].  If you have an urgent issue when the clinic is closed that cannot wait until the next business day, you can page your doctor at the number below.    Please note that while we do our best to be available for urgent issues outside of office hours, we are not available 24/7.   If you have an urgent issue and are unable to reach [place], you may choose to seek medical care at your doctor's office, retail clinic, urgent care center, or emergency room.  If you have a medical emergency, please immediately call 911 or go to the emergency department.  Pager Numbers  - Dr. Gwen Pounds: [phone]  - Dr. Roseanne Reno: [phone]  - Dr. Katrinka Blazing: [phone]   In the event of inclement weather, please call our main line at [phone] for an update on the status of any delays or closures.  Dermatology Medication Tips: Please keep the boxes that  topical medications come in in order to help keep track of the instructions about where and how to use these. Pharmacies typically print the medication instructions only on the boxes and not directly on the medication tubes.   If your medication is too expensive, please contact our office at [phone] option 4 or send [place] a message through MyChart.   We are unable to tell what your co-pay for medications will be in advance as this is different depending on your insurance coverage. However, we may be able to find a substitute medication at lower cost  or fill out paperwork to get insurance to cover a needed medication.   If a prior authorization is required to get your medication covered by your insurance company, please allow [place] 1-2 business days to complete this process.  Drug prices often vary depending on where the prescription is filled and some pharmacies may offer cheaper prices.  The website www.goodrx.com contains coupons for medications through different pharmacies. The prices here do not account for what the cost may be with help from insurance (it may be cheaper with your insurance), but the website can give you the price if you did not use any insurance.  - You can print the associated coupon and take it with your prescription to the pharmacy.  - You may also stop by our office during regular business hours and pick up a GoodRx coupon card.  - If you need your prescription sent electronically to a different pharmacy, notify our office through [facility] or by phone at [phone] option 4.     Si Usted Necesita Algo Despus de Su Visita  Tambin puede enviarnos un mensaje a travs de Clinical cytogeneticist. Por lo general respondemos a los mensajes de MyChart en el transcurso de 1 a 2 das hbiles.  Para renovar recetas, por favor pida a su farmacia que se ponga en contacto con nuestra oficina. Annie Sable de fax es [place] [phone].  Si tiene un asunto urgente cuando la clnica est cerrada y que no puede esperar hasta el siguiente da hbil, puede llamar/localizar a su doctor(a) al nmero que aparece a continuacin.   Por favor, tenga en cuenta que aunque hacemos todo lo posible para estar disponibles para asuntos urgentes fuera del horario de [place], no estamos disponibles las 24 horas del da, los 7 [address]  [address] de la [place].   Si tiene un problema urgente y no puede comunicarse con nosotros, puede optar por buscar atencin mdica  en el consultorio de su doctor(a), en una clnica privada, en un centro de atencin urgente o en una  sala de emergencias.  Si tiene Engineer, drilling, por favor llame inmediatamente al 911 o vaya a la sala de emergencias.  Nmeros de bper  - Dr. Gwen Pounds: [phone]  - Dra. Roseanne Reno: [phone]  - Dr. Katrinka Blazing: [phone]   En caso de inclemencias del tiempo, por favor llame a Lacy Duverney principal al [phone] para una actualizacin sobre el [place] de cualquier retraso o cierre.  Consejos para la medicacin en dermatologa: Por favor, guarde las cajas en las que vienen los medicamentos de uso tpico para ayudarle a seguir las instrucciones sobre dnde y cmo usarlos. Las farmacias generalmente imprimen las instrucciones del medicamento slo en las cajas y no directamente en los tubos del [place].   Si su medicamento es muy caro, por favor, pngase en contacto con Rolm Gala llamando al [phone] y presione la opcin 4 o envenos un mensaje a travs de Clinical cytogeneticist.  No podemos decirle cul ser su copago por los medicamentos por adelantado ya que esto es diferente dependiendo de la cobertura de su seguro. Sin embargo, es posible que podamos encontrar un medicamento sustituto a Audiological scientist un formulario para que el seguro cubra el medicamento que se considera necesario.   Si se requiere una autorizacin previa para que su compaa de seguros [place] su medicamento, por favor permtanos de 1 a 2 das hbiles para completar [address]  Los precios de los medicamentos varan con frecuencia dependiendo del Environmental consultant de dnde se surte la receta y alguna farmacias pueden ofrecer precios ms baratos.  El sitio web www.goodrx.com tiene cupones para medicamentos de Health and safety inspector. Los precios aqu no tienen en cuenta lo que podra costar con la ayuda del seguro (puede ser ms barato con su seguro), pero el sitio web puede darle el precio si no utiliz Tourist information centre manager.  - Puede imprimir el cupn correspondiente y llevarlo con su receta a la farmacia.  - Tambin puede  pasar por nuestra oficina durante el horario de atencin regular y Education officer, museum una tarjeta de cupones de GoodRx.  - Si necesita que su receta se enve electrnicamente a una farmacia diferente, informe a nuestra oficina a travs de MyChart de [place] o por telfono llamando al [phone] y presione la opcin 4.

## 2023-04-26 NOTE — Progress Notes (Addendum)
Follow-Up Visit   Subjective  Adriana Hahn is a 33 y.o. female who presents for the following: Androgenetic alopecia 5 month follow-up. She is taking Spironolactone 100 MG daily and Minoxidil 2.5 MG daily, improving - hasn't noticed hair falling out. No side effects, no lightheadedness/dizziness.  She has a history of dermatitis and uses hydrocortisone 2.5% cream (burns) as needed. She uses Dove soap and Palmer's dry skin cream (helps most).  She had a low Vit D level and wants to know what she should take for it.    The following portions of the chart were reviewed this encounter and updated as appropriate: medications, allergies, medical history  Review of Systems:  No other skin or systemic complaints except as noted in HPI or Assessment and Plan.  Objective  Well appearing patient in no apparent distress; mood and affect are within normal limits.  A focused examination was performed of the following areas: Face, scalp, extremities  Relevant physical exam findings are noted in the Assessment and Plan.    Assessment & Plan  ANDROGENETIC ALOPECIA (FEMALE PATTERN HAIR LOSS) Exam: Diffuse thinning of the crown and widening of the midline part with retention of the frontal hairline.  Midline part with narrowing when compared to baseline photos.  Increase hair growth BL temporal scalp.  Chronic and persistent condition with duration or expected duration over one year. Condition is improving with treatment but not currently at goal. Improved compared to photos from 11/16/2022.   Female Androgenic Alopecia is a chronic condition related to genetics and/or hormonal changes.  In women androgenetic alopecia is commonly associated with menopause but may occur any time after puberty.  It causes hair thinning primarily on the crown with widening of the part and temporal hairline recession.  Can use OTC Rogaine (minoxidil) 5% solution/foam as directed.  Oral treatments in female patients who have  no contraindication may include : - Low dose oral minoxidil 1.25 - 5mg  daily - Spironolactone 50 - 100mg  bid - Finasteride 2.5 - 5 mg daily Adjunctive therapies include: - Low Level Laser Light Therapy (LLLT) - Platelet-rich plasma injections (PRP) - Hair Transplants or scalp reduction   Treatment Plan: CBC, TSH and Vit D labs reviewed from 04/20/23  Start Vitamin D 5000 international units daily. Continue Spironolactone 100 MG 1 po every day Continue Minoxidil 2.5 MG 1 po every day.   Doses of minoxidil for hair loss are considered 'low dose'. This is because the doses used for hair loss are much lower than the doses which are used for conditions such as high blood pressure (hypertension). The doses used for hypertension are 10-40mg  per day.  Side effects are uncommon at the low doses (up to 2.5 mg/day) used to treat hair loss. Potential side effects, more commonly seen at higher doses, include: Increase in hair growth (hypertrichosis) elsewhere on face and body Temporary hair shedding upon starting medication which may last up to 4 weeks Ankle swelling, fluid retention, rapid weight gain more than 5 pounds Low blood pressure and feeling lightheaded or dizzy when standing up quickly Fast or irregular heartbeat Headaches   Spironolactone can cause increased urination and cause blood pressure to decrease. Please watch for signs of lightheadedness and be cautious when changing position. It can sometimes cause breast tenderness or an irregular period in premenopausal women. It can also increase potassium. The increase in potassium usually is not a concern unless you are taking other medicines that also increase potassium, so please be sure your doctor  knows all of the other medications you are taking. This medication should not be taken by pregnant women.  This medicine should also not be taken together with sulfa drugs like Bactrim (trimethoprim/sulfamethexazole).     Long term medication  management.  Patient is using long term (months to years) prescription medication  to control their dermatologic condition.  These medications require periodic monitoring to evaluate for efficacy and side effects and may require periodic laboratory monitoring.   Nummular Dermatitis Exam: Small pink scaly patch right upper arm, left upper thigh  Chronic and persistent condition with duration or expected duration over one year. Condition is symptomatic/ bothersome to patient. Not currently at goal.   Nummular dermatitis (eczema) is a chronic, relapsing, itchy rash that can significantly affect quality of life. It is often associated with dry skin and flares in the wintertime, and may require treatment with prescription topical anti-inflammatory medications, in addition to gentle skin care.  If there is associated atopic dermatitis and topicals are not working, then biologic injections may be necessary to clear rash and control symptoms.  Treatment Plan:  Switch hydrocortisone 2.5% to ointment (cream burns) Apply once to twice daily to affected areas rash until improved Recommend mild soap and moisturizing cream 1-2 times daily.  Gentle skin care handout provided.  Continue Dove soap and Palmer's Cream (pt thinks this helps more than Eucerin)   Return in about 6 months (around 10/24/2023) for Alopecia, Dermatitis.  ICherlyn Labella, CMA, am acting as scribe for Willeen Niece, MD .   Documentation: I have reviewed the above documentation for accuracy and completeness, and I agree with the above.  Willeen Niece, MD

## 2023-05-04 LAB — HM DIABETES EYE EXAM

## 2023-05-05 ENCOUNTER — Encounter: Payer: Self-pay | Admitting: Medical

## 2023-05-26 ENCOUNTER — Ambulatory Visit: Payer: Medicaid Other | Admitting: Obstetrics and Gynecology

## 2023-05-26 ENCOUNTER — Other Ambulatory Visit (HOSPITAL_COMMUNITY)
Admission: RE | Admit: 2023-05-26 | Discharge: 2023-05-26 | Disposition: A | Discharge status: Home / Self Care | Payer: Medicaid Other | Source: Ambulatory Visit | Attending: Obstetrics and Gynecology | Admitting: Obstetrics and Gynecology

## 2023-05-26 ENCOUNTER — Encounter: Payer: Self-pay | Admitting: Obstetrics and Gynecology

## 2023-05-26 ENCOUNTER — Ambulatory Visit (INDEPENDENT_AMBULATORY_CARE_PROVIDER_SITE_OTHER): Payer: Medicaid Other

## 2023-05-26 VITALS — BP 128/76 | HR 86

## 2023-05-26 DIAGNOSIS — Z01419 Encounter for gynecological examination (general) (routine) without abnormal findings: Secondary | ICD-10-CM

## 2023-05-26 DIAGNOSIS — E282 Polycystic ovarian syndrome: Secondary | ICD-10-CM

## 2023-05-26 DIAGNOSIS — N938 Other specified abnormal uterine and vaginal bleeding: Secondary | ICD-10-CM

## 2023-05-26 DIAGNOSIS — Z01812 Encounter for preprocedural laboratory examination: Secondary | ICD-10-CM

## 2023-05-26 LAB — PREGNANCY, URINE: Preg Test, Ur: NEGATIVE

## 2023-05-26 NOTE — Progress Notes (Signed)
33 y.o. y.o. female here for PUS and EMB Patient's last menstrual period was 05/01/2023 (exact date). Period Duration (Days):  (varies) Period Pattern: (!) Irregular Menstrual Flow: Moderate (can have clots) Menstrual Control: Maxi pad, Tampon Dysmenorrhea:  (all severity) Dysmenorrhea Symptoms: Cramping, Headache  G1P1L1 Married.  Daughter is 72 y/o Has been trying to conceive with same partner for a year with no success. Would like to continue naturally. Periods q month until Oct and Nov she had two periods in each month.  In October she bled on the 1st through the 10th and the on the 20th starting bleeding again. She reports heavy bleeding, pain and some clots.   PUS today Anteverted 7.7cm uterus Normal size and shape No myometrial masses  Thin symmetrical endometrium No masses or thickening seen Avascular  Normal ovaries   HPI:   H/O conception on Metformin/Clomid/IUI with Dx of PCOS at that time, SVD, induction for Pre-Eclampsia.  Not using contraception currently.  Not exercising regularly. Healthy nutrition.   There is no height or weight on file to calculate BMI. Last BMI 31 MMG: never   Show images for US PELVIC COMPLETE WITH TRANSVAGINAL Study Result  Narrative & Impression  CLINICAL DATA:  Right lower quadrant pain intermittently. History of PCOS.   EXAM: TRANSABDOMINAL AND TRANSVAGINAL ULTRASOUND OF PELVIS   TECHNIQUE: Both transabdominal and transvaginal ultrasound examinations of the pelvis were performed. Transabdominal technique was performed for global imaging of the pelvis including uterus, ovaries, adnexal regions, and pelvic cul-de-sac. It was necessary to proceed with endovaginal exam following the transabdominal exam to visualize the endometrium and ovaries.   COMPARISON:  None Available.   FINDINGS: Uterus   Measurements: 8.6 x 4.6 x 5.6 cm = volume: 115 mL. No fibroids or other mass visualized.   Endometrium   Thickness: 10.6  mm.  No focal abnormality visualized.   Right ovary   Measurements: 3.8 x 2.6 x 2.6 cm = volume: 13.4 mL. Normal appearance/no adnexal mass.   Left ovary   Measurements: 3.2 x 2.4 x 2.1 cm = volume: 8.5 mL. Normal appearance/no adnexal mass.   Other findings   No abnormal free fluid.   IMPRESSION: Normal study.          04/20/2023    1:48 PM 05/26/2022    3:14 PM 02/24/2022    3:23 PM  Depression screen PHQ 2/9  Decreased Interest 0 0 0  Down, Depressed, Hopeless 0 0 0  PHQ - 2 Score 0 0 0  Altered sleeping  0   Tired, decreased energy  0   Change in appetite  0   Feeling bad or failure about yourself   0   Trouble concentrating  0   Moving slowly or fidgety/restless  0   Suicidal thoughts  0   PHQ-9 Score  0   Difficult doing work/chores  Not difficult at all     Blood pressure 128/76, pulse 86, last menstrual period 05/01/2023, SpO2 97%.     Component Value Date/Time   DIAGPAP  04/20/2023 1410    - Negative for intraepithelial lesion or malignancy (NILM)   DIAGPAP  01/20/2022 1043    - Negative for intraepithelial lesion or malignancy (NILM)   DIAGPAP  01/08/2021 0907    - Negative for Intraepithelial Lesions or Malignancy (NILM)   DIAGPAP - Benign reactive/reparative changes 01/08/2021 0907   HPVHIGH Negative 04/20/2023 1410   ADEQPAP  04/20/2023 1410    Satisfactory for evaluation; transformation zone component  ABSENT.   ADEQPAP  01/20/2022 1043    Satisfactory for evaluation; transformation zone component PRESENT.   ADEQPAP  01/08/2021 0907    Satisfactory for evaluation; transformation zone component PRESENT.    GYN HISTORY:    Component Value Date/Time   DIAGPAP  04/20/2023 1410    - Negative for intraepithelial lesion or malignancy (NILM)   DIAGPAP  01/20/2022 1043    - Negative for intraepithelial lesion or malignancy (NILM)   DIAGPAP  01/08/2021 0907    - Negative for Intraepithelial Lesions or Malignancy (NILM)   DIAGPAP - Benign  reactive/reparative changes 01/08/2021 0907   HPVHIGH Negative 04/20/2023 1410   ADEQPAP  04/20/2023 1410    Satisfactory for evaluation; transformation zone component ABSENT.   ADEQPAP  01/20/2022 1043    Satisfactory for evaluation; transformation zone component PRESENT.   ADEQPAP  01/08/2021 0907    Satisfactory for evaluation; transformation zone component PRESENT.    OB History  Gravida Para Term Preterm AB Living  1 1 1  0 0 1  SAB IAB Ectopic Multiple Live Births  0 0 0 0 1    # Outcome Date GA Lbr Len/2nd Weight Sex Type Anes PTL Lv  1 Term 05/29/19 [redacted]w[redacted]d 10:19 / 00:17 6 lb 14.8 oz (3.14 kg) F Vag-Spont EPI  LIV    Past Medical History:  Diagnosis Date   Gestational diabetes    Migraines    PCOS (polycystic ovarian syndrome)     Past Surgical History:  Procedure Laterality Date   NO PAST SURGERIES      Current Outpatient Medications on File Prior to Visit  Medication Sig Dispense Refill   Accu-Chek Softclix Lancets lancets CHECK blood sugar BID dx: E11.9 100 each 12   Blood Glucose Monitoring Suppl (ACCU-CHEK GUIDE) w/Device KIT 1 kit by Does not apply route daily. 1 kit 2   glucose blood (ACCU-CHEK GUIDE) test strip Check blood sugar BID dx:E11.9 100 each 12   hydrocortisone 2.5 % ointment Apply to affected areas rash once to twice daily until improved. 30 g 3   ibuprofen (ADVIL) 600 MG tablet Take 1 tablet (600 mg total) by mouth every 6 (six) hours. 30 tablet 0   metFORMIN (GLUCOPHAGE) 500 MG tablet Take 1 tablet (500 mg total) by mouth 2 (two) times daily with a meal. 60 tablet 11   minoxidil (LONITEN) 2.5 MG tablet Take 1 tablet by mouth every day. 30 tablet 4   spironolactone (ALDACTONE) 100 MG tablet Take 1 tablet (100 mg total) by mouth daily. 30 tablet 4   VITAMIN D PO Take by mouth.     hydrOXYzine (VISTARIL) 25 MG capsule 1 tab po q hs prn itching (Patient not taking: Reported on 05/26/2023) 7 capsule 0   No current facility-administered medications on  file prior to visit.    Social History   Socioeconomic History   Marital status: Married    Spouse name: Stephens November   Number of children: 0   Years of education: 13   Highest education level: 8th grade  Occupational History   Occupation: unemployed  Tobacco Use   Smoking status: Never   Smokeless tobacco: Never  Vaping Use   Vaping status: Never Used  Substance and Sexual Activity   Alcohol use: Yes    Comment: occ   Drug use: No   Sexual activity: Yes    Partners: Male    Birth control/protection: None  Other Topics Concern   Not on file  Social History  Narrative   Not on file   Social Drivers of Health   Financial Resource Strain: Patient Declined (03/04/2023)   Overall Financial Resource Strain (CARDIA)    Difficulty of Paying Living Expenses: Patient declined  Food Insecurity: Unknown (03/04/2023)   Hunger Vital Sign    Worried About Running Out of Food in the Last Year: Patient declined    Ran Out of Food in the Last Year: Never true  Transportation Needs: No Transportation Needs (03/04/2023)   PRAPARE - Administrator, Civil Service (Medical): No    Lack of Transportation (Non-Medical): No  Physical Activity: Insufficiently Active (03/04/2023)   Exercise Vital Sign    Days of Exercise per Week: 4 days    Minutes of Exercise per Session: 30 min  Stress: No Stress Concern Present (03/04/2023)   [employer] of Occupational Health - Occupational Stress Questionnaire    Feeling of Stress : Not at all  Social Connections: Unknown (03/04/2023)   Social Connection and Isolation Panel [NHANES]    Frequency of Communication with Friends and Family: Three times a week    Frequency of Social Gatherings with Friends and Family: Patient declined    Attends Religious Services: Patient declined    Database administrator or Organizations: No    Attends Engineer, structural: Not on file    Marital Status: Married  Catering manager  Violence: Not on file    Family History  Problem Relation Age of Onset   Diabetes Mother      Allergies  Allergen Reactions   Cherry Anaphylaxis   Almond (Diagnostic) Itching   Apple    Apple Juice Itching    Red apples cause this more than other varieties of apples   Black [employer] (Non-Screening) Itching      Patient's last menstrual period was Patient's last menstrual period was 05/01/2023 (exact date)..            Review of Systems Alls systems reviewed and are negative.     Physical Exam Constitutional:      Appearance: Normal appearance.  Genitourinary:     Vulva and urethral meatus normal.     No lesions in the vagina.     Right Labia: No rash, lesions or skin changes.    Left Labia: No lesions, skin changes or rash.    No vaginal discharge or tenderness.     No vaginal prolapse present.    No vaginal atrophy present.     Right Adnexa: not tender, not palpable and no mass present.    Left Adnexa: not tender, not palpable and no mass present.    No cervical motion tenderness or discharge.     Uterus is not enlarged, tender or irregular.  Breasts:    Right: Normal.     Left: Normal.  HENT:     Head: Normocephalic.  Neck:     Thyroid: No thyroid mass, thyromegaly or thyroid tenderness.  Cardiovascular:     Rate and Rhythm: Normal rate and regular rhythm.     Heart sounds: Normal heart sounds, S1 normal and S2 normal.  Pulmonary:     Effort: Pulmonary effort is normal.     Breath sounds: Normal breath sounds and air entry.  Abdominal:     General: There is no distension.     Palpations: Abdomen is soft. There is no mass.     Tenderness: There is no abdominal tenderness. There is no guarding or rebound.  Musculoskeletal:        General: Normal range of motion.     Cervical back: Full passive range of motion without pain, normal range of motion and neck supple. No tenderness.     Right lower leg: No edema.     Left lower leg: No edema.   Neurological:     Mental Status: She is alert.  Skin:    General: Skin is warm.  Psychiatric:        Mood and Affect: Mood normal.        Behavior: Behavior normal.        Thought Content: Thought content normal.  Vitals and nursing note reviewed. Exam conducted with a chaperone present.    Joy presented to chaperone during the exam  PROCEDURE: EMB Consent obtained for the procedure.  A bivalve speculum was placed in the vagina. Time out performed. The cervix was grasped with a single tooth tenaculum.  Pipelle was inserted and rotated.  Adequate specimen was obtained and sent to pathology.  All instruments were removed.  Patient tolerated the procedure well.  To notify patient of the results.     A:         PCO, DUB for PUS and EMB                             P:        Pap smear collected today PUS normal today.  Printed and reviewed with patient EMB collected today.  To notify patient of the results  No follow-ups on file.  Earley Favor

## 2023-05-30 ENCOUNTER — Encounter: Payer: Self-pay | Admitting: Obstetrics and Gynecology

## 2023-05-30 LAB — SURGICAL PATHOLOGY

## 2023-07-05 ENCOUNTER — Other Ambulatory Visit: Payer: Self-pay | Admitting: Medical

## 2023-07-05 ENCOUNTER — Encounter: Payer: Self-pay | Admitting: Obstetrics and Gynecology

## 2023-07-05 ENCOUNTER — Ambulatory Visit: Admitting: Obstetrics and Gynecology

## 2023-07-05 VITALS — BP 116/74 | HR 94

## 2023-07-05 DIAGNOSIS — N926 Irregular menstruation, unspecified: Secondary | ICD-10-CM

## 2023-07-05 DIAGNOSIS — R5382 Chronic fatigue, unspecified: Secondary | ICD-10-CM

## 2023-07-05 DIAGNOSIS — N938 Other specified abnormal uterine and vaginal bleeding: Secondary | ICD-10-CM

## 2023-07-05 DIAGNOSIS — E282 Polycystic ovarian syndrome: Secondary | ICD-10-CM

## 2023-07-05 LAB — PREGNANCY, URINE: Preg Test, Ur: NEGATIVE

## 2023-07-05 MED ORDER — DROSPIRENONE-ETHINYL ESTRADIOL 3-0.02 MG PO TABS: 1.0000 | ORAL_TABLET | Freq: Every day | ORAL | 3 refills | Status: DC

## 2023-07-05 MED ORDER — PROGESTERONE MICRONIZED 100 MG PO CAPS: 100.0000 mg | ORAL_CAPSULE | Freq: Every day | ORAL | 0 refills | Status: DC

## 2023-07-06 LAB — IRON,TIBC AND FERRITIN PANEL
%SAT: 19 % (ref 16–45)
Ferritin: 21 ng/mL (ref 16–154)
Iron: 74 ug/dL (ref 40–190)
TIBC: 395 ug/dL (ref 250–450)

## 2023-07-06 LAB — HEMOGLOBIN A1C
Hgb A1c MFr Bld: 6.4 %{Hb} — ABNORMAL HIGH (ref <5.7)
Mean Plasma Glucose: 137 mg/dL
eAG (mmol/L): 7.6 mmol/L

## 2023-07-06 LAB — CBC
HCT: 38.1 % (ref 35.0–45.0)
Hemoglobin: 12.7 g/dL (ref 11.7–15.5)
MCH: 29.1 pg (ref 27.0–33.0)
MCHC: 33.3 g/dL (ref 32.0–36.0)
MCV: 87.4 fL (ref 80.0–100.0)
MPV: 12.3 fL (ref 7.5–12.5)
Platelets: 335 10*3/uL (ref 140–400)
RBC: 4.36 10*6/uL (ref 3.80–5.10)
RDW: 13.4 % (ref 11.0–15.0)
WBC: 11 10*3/uL — ABNORMAL HIGH (ref 3.8–10.8)

## 2023-07-06 LAB — TSH: TSH: 0.73 m[IU]/L

## 2023-07-07 ENCOUNTER — Encounter: Payer: Self-pay | Admitting: Obstetrics and Gynecology

## 2023-07-07 NOTE — Progress Notes (Signed)
 33 y.o. y.o. female here for follow-up for DUB. Reports having her period since January 26. Feb, had an EMB that was normal She is using 3-4 pads per day She has A2DM  No LMP recorded (exact date). (Menstrual status: Irregular Periods).    G1P1L1 Married.  Daughter is 5 y/o Has been trying to conceive with same partner for a year with no success, since after the birth of her child. Would like to continue naturally. Periods q month until Oct and Nov she had two periods in each month.  In October she bled on the 1st through the 10th and the on the 20th starting bleeding again. She reports heavy bleeding, pain and some clots. Two years ago, she took 6 months of ocp's to regulate her cycle then had their SAB.  She could not tolerate metformin twice a day due to GI side effects and low sugars.      Component Ref Range & Units (hover) 1 mo ago  SURGICAL PATHOLOGY SURGICAL PATHOLOGY CASE: [id number] PATIENT: Adriana Hahn Surgical Pathology Report     Clinical History: PCO, DUB (cm)     FINAL MICROSCOPIC DIAGNOSIS:  A. ENDOMETRIUM, BIOPSY:      Benign disordered proliferative endometrium.      Negative for hyperplasia, atypia or malignancy.   GROSS DESCRIPTION:     PUS today Anteverted 7.7cm uterus Normal size and shape No myometrial masses  Thin symmetrical endometrium No masses or thickening seen Avascular  Normal ovaries   HPI:   H/O conception on Metformin/Clomid/IUI with Dx of PCOS at that time, SVD, induction for Pre-Eclampsia.  Not using contraception currently.  Not exercising regularly. Healthy nutrition.   There is no height or weight on file to calculate BMI. Last BMI 31 MMG: never   Show images for US PELVIC COMPLETE WITH TRANSVAGINAL Study Result  Narrative & Impression  CLINICAL DATA:  Right lower quadrant pain intermittently. History of PCOS.   EXAM: TRANSABDOMINAL AND TRANSVAGINAL ULTRASOUND OF PELVIS   TECHNIQUE: Both  transabdominal and transvaginal ultrasound examinations of the pelvis were performed. Transabdominal technique was performed for global imaging of the pelvis including uterus, ovaries, adnexal regions, and pelvic cul-de-sac. It was necessary to proceed with endovaginal exam following the transabdominal exam to visualize the endometrium and ovaries.   COMPARISON:  None Available.   FINDINGS: Uterus   Measurements: 8.6 x 4.6 x 5.6 cm = volume: 115 mL. No fibroids or other mass visualized.   Endometrium   Thickness: 10.6 mm.  No focal abnormality visualized.   Right ovary   Measurements: 3.8 x 2.6 x 2.6 cm = volume: 13.4 mL. Normal appearance/no adnexal mass.   Left ovary   Measurements: 3.2 x 2.4 x 2.1 cm = volume: 8.5 mL. Normal appearance/no adnexal mass.   Other findings   No abnormal free fluid.   IMPRESSION: Normal study.          04/20/2023    1:48 PM 05/26/2022    3:14 PM 02/24/2022    3:23 PM  Depression screen PHQ 2/9  Decreased Interest 0 0 0  Down, Depressed, Hopeless 0 0 0  PHQ - 2 Score 0 0 0  Altered sleeping  0   Tired, decreased energy  0   Change in appetite  0   Feeling bad or failure about yourself   0   Trouble concentrating  0   Moving slowly or fidgety/restless  0   Suicidal thoughts  0   PHQ-9 Score  0   Difficult doing work/chores  Not difficult at all     Blood pressure 116/74, pulse 94, SpO2 98%.     Component Value Date/Time   DIAGPAP  04/20/2023 1410    - Negative for intraepithelial lesion or malignancy (NILM)   DIAGPAP  01/20/2022 1043    - Negative for intraepithelial lesion or malignancy (NILM)   DIAGPAP  01/08/2021 0907    - Negative for Intraepithelial Lesions or Malignancy (NILM)   DIAGPAP - Benign reactive/reparative changes 01/08/2021 0907   HPVHIGH Negative 04/20/2023 1410   ADEQPAP  04/20/2023 1410    Satisfactory for evaluation; transformation zone component ABSENT.   ADEQPAP  01/20/2022 1043    Satisfactory  for evaluation; transformation zone component PRESENT.   ADEQPAP  01/08/2021 0907    Satisfactory for evaluation; transformation zone component PRESENT.    GYN HISTORY:    Component Value Date/Time   DIAGPAP  04/20/2023 1410    - Negative for intraepithelial lesion or malignancy (NILM)   DIAGPAP  01/20/2022 1043    - Negative for intraepithelial lesion or malignancy (NILM)   DIAGPAP  01/08/2021 0907    - Negative for Intraepithelial Lesions or Malignancy (NILM)   DIAGPAP - Benign reactive/reparative changes 01/08/2021 0907   HPVHIGH Negative 04/20/2023 1410   ADEQPAP  04/20/2023 1410    Satisfactory for evaluation; transformation zone component ABSENT.   ADEQPAP  01/20/2022 1043    Satisfactory for evaluation; transformation zone component PRESENT.   ADEQPAP  01/08/2021 0907    Satisfactory for evaluation; transformation zone component PRESENT.    OB History  Gravida Para Term Preterm AB Living  1 1 1  0 0 1  SAB IAB Ectopic Multiple Live Births  0 0 0 0 1    # Outcome Date GA Lbr Len/2nd Weight Sex Type Anes PTL Lv  1 Term 05/29/19 [redacted]w[redacted]d 10:19 / 00:17 6 lb 14.8 oz (3.14 kg) F Vag-Spont EPI  LIV    Past Medical History:  Diagnosis Date   Gestational diabetes    Migraines    PCOS (polycystic ovarian syndrome)     Past Surgical History:  Procedure Laterality Date   NO PAST SURGERIES      Current Outpatient Medications on File Prior to Visit  Medication Sig Dispense Refill   Blood Glucose Monitoring Suppl (ACCU-CHEK GUIDE) w/Device KIT 1 kit by Does not apply route daily. 1 kit 2   hydrocortisone 2.5 % ointment Apply to affected areas rash once to twice daily until improved. 30 g 3   hydrOXYzine (VISTARIL) 25 MG capsule 1 tab po q hs prn itching 7 capsule 0   ibuprofen (ADVIL) 600 MG tablet Take 1 tablet (600 mg total) by mouth every 6 (six) hours. 30 tablet 0   minoxidil (LONITEN) 2.5 MG tablet Take 1 tablet by mouth every day. 30 tablet 4   spironolactone  (ALDACTONE) 100 MG tablet Take 1 tablet (100 mg total) by mouth daily. 30 tablet 4   VITAMIN D PO Take by mouth.     No current facility-administered medications on file prior to visit.    Social History   Socioeconomic History   Marital status: Married    Spouse name: Stephens November   Number of children: 0   Years of education: 13   Highest education level: 8th grade  Occupational History   Occupation: unemployed  Tobacco Use   Smoking status: Never   Smokeless tobacco: Never  Vaping Use   Vaping status: Never Used  Substance and Sexual Activity   Alcohol use: Yes    Comment: occ   Drug use: No   Sexual activity: Yes    Partners: Male    Birth control/protection: None  Other Topics Concern   Not on file  Social History Narrative   Not on file   Social Drivers of Health   Financial Resource Strain: Patient Declined (03/04/2023)   Overall Financial Resource Strain (CARDIA)    Difficulty of Paying Living Expenses: Patient declined  Food Insecurity: Unknown (03/04/2023)   Hunger Vital Sign    Worried About Running Out of Food in the Last Year: Patient declined    Ran Out of Food in the Last Year: Never true  Transportation Needs: No Transportation Needs (03/04/2023)   PRAPARE - Administrator, Civil Service (Medical): No    Lack of Transportation (Non-Medical): No  Physical Activity: Insufficiently Active (03/04/2023)   Exercise Vital Sign    Days of Exercise per Week: 4 days    Minutes of Exercise per Session: 30 min  Stress: No Stress Concern Present (03/04/2023)   [employer] of Occupational Health - Occupational Stress Questionnaire    Feeling of Stress : Not at all  Social Connections: Unknown (03/04/2023)   Social Connection and Isolation Panel [NHANES]    Frequency of Communication with Friends and Family: Three times a week    Frequency of Social Gatherings with Friends and Family: Patient declined    Attends Religious Services: Patient  declined    Database administrator or Organizations: No    Attends Engineer, structural: Not on file    Marital Status: Married  Catering manager Violence: Not on file    Family History  Problem Relation Age of Onset   Diabetes Mother      Allergies  Allergen Reactions   Cherry Anaphylaxis   Almond (Diagnostic) Itching   Apple    Apple Juice Itching    Red apples cause this more than other varieties of apples   Black [employer] (Non-Screening) Itching      Patient's last menstrual period was No LMP recorded (exact date). (Menstrual status: Irregular Periods)..               A:         PCO, DUB                              P:         To begin daily prometrium at bedtime 100mg  to see if this will stop the bleeding.  Can increase to 200mg  if still bleeding after 7 days.  When she she stops, to begin ocp's (yaz) to have a few regular cycles and when she is ready to begin ovulation medication with letrozole on CD3.  Patient encouraged to call on CD1 when she is ready Begin daily ovasitol for cycle regularity.  30 minutes spent on reviewing records, imaging,  and one on one patient time and counseling patient and documentation Dr. Karma Greaser  Dr. Judith Blonder

## 2023-07-15 ENCOUNTER — Other Ambulatory Visit: Payer: Self-pay | Admitting: Obstetrics and Gynecology

## 2023-07-15 NOTE — Telephone Encounter (Signed)
 Requesting refill since having to double up on 100mg  progesterone due to bleeding not stopping yet.   Rx pend.

## 2023-07-18 MED ORDER — PROGESTERONE MICRONIZED 100 MG PO CAPS: 200.0000 mg | ORAL_CAPSULE | Freq: Every day | ORAL | 0 refills | Status: DC

## 2023-08-05 ENCOUNTER — Other Ambulatory Visit: Payer: Self-pay | Admitting: Medical

## 2023-09-03 ENCOUNTER — Other Ambulatory Visit: Payer: Self-pay | Admitting: Medical

## 2023-09-19 ENCOUNTER — Other Ambulatory Visit: Payer: Self-pay | Admitting: Obstetrics and Gynecology

## 2023-09-20 NOTE — Telephone Encounter (Signed)
 Med refill request: Progesterone   Last AEX:04/20/23, last OV 07/05/23 Next AEX: 04/23/24 Last MMG (if hormonal med) n/a Refill authorized: last rx 07/18/23 #60 with 0 refills. Please approve or deny

## 2023-10-03 ENCOUNTER — Ambulatory Visit: Payer: Self-pay | Admitting: Medical

## 2023-10-03 ENCOUNTER — Ambulatory Visit (INDEPENDENT_AMBULATORY_CARE_PROVIDER_SITE_OTHER): Admitting: Medical

## 2023-10-03 VITALS — BP 122/84 | HR 79 | Temp 98.4°F | Resp 18 | Ht 60.0 in | Wt 151.6 lb

## 2023-10-03 DIAGNOSIS — N926 Irregular menstruation, unspecified: Secondary | ICD-10-CM | POA: Diagnosis not present

## 2023-10-03 DIAGNOSIS — G4700 Insomnia, unspecified: Secondary | ICD-10-CM | POA: Diagnosis not present

## 2023-10-03 DIAGNOSIS — G43801 Other migraine, not intractable, with status migrainosus: Secondary | ICD-10-CM | POA: Diagnosis not present

## 2023-10-03 DIAGNOSIS — R5383 Other fatigue: Secondary | ICD-10-CM | POA: Diagnosis not present

## 2023-10-03 LAB — CBC WITH DIFFERENTIAL/PLATELET
Basophils Absolute: 0 10*3/uL (ref 0.0–0.1)
Basophils Relative: 0.5 % (ref 0.0–3.0)
Eosinophils Absolute: 0.2 10*3/uL (ref 0.0–0.7)
Eosinophils Relative: 2.4 % (ref 0.0–5.0)
HCT: 39.2 % (ref 36.0–46.0)
Hemoglobin: 13.1 g/dL (ref 12.0–15.0)
Lymphocytes Relative: 26 % (ref 12.0–46.0)
Lymphs Abs: 2.2 10*3/uL (ref 0.7–4.0)
MCHC: 33.4 g/dL (ref 30.0–36.0)
MCV: 87.2 fl (ref 78.0–100.0)
Monocytes Absolute: 0.3 10*3/uL (ref 0.1–1.0)
Monocytes Relative: 4 % (ref 3.0–12.0)
Neutro Abs: 5.6 10*3/uL (ref 1.4–7.7)
Neutrophils Relative %: 67.1 % (ref 43.0–77.0)
Platelets: 295 10*3/uL (ref 150.0–400.0)
RBC: 4.5 Mil/uL (ref 3.87–5.11)
RDW: 13.5 % (ref 11.5–15.5)
WBC: 8.3 10*3/uL (ref 4.0–10.5)

## 2023-10-03 MED ORDER — KETOROLAC TROMETHAMINE 60 MG/2ML IM SOLN
60.0000 mg | Freq: Once | INTRAMUSCULAR | Status: AC
  Administered 2023-10-03: 60 mg via INTRAMUSCULAR

## 2023-10-03 MED ORDER — SUMATRIPTAN SUCCINATE 50 MG PO TABS
50.0000 mg | ORAL_TABLET | ORAL | 0 refills | Status: DC | PRN
Start: 2023-10-03 — End: 2023-12-05

## 2023-10-03 MED ORDER — TRAZODONE HCL 50 MG PO TABS: 25.0000 mg | ORAL_TABLET | Freq: Every evening | ORAL | 0 refills | Status: DC | PRN

## 2023-10-03 NOTE — Progress Notes (Signed)
 Subjective:    Patient ID: Adriana Hahn, female    DOB: May 06, 1990, 33 y.o.   MRN: [id number]  HPI  Adriana Hahn is a 33 year old female with migraines who presents with worsening headaches over the past month.  She has been experiencing headaches for the past month, initially mild but worsening over the last two weeks. The pain is described as a pulsating sensation in both ears, accompanied by pressure in the eyes. The headaches have been severe enough to cause nausea and vomiting, particularly during an episode last week. The headaches are exacerbated by light and noise. She has a history of migraines since age 46. Tyically head once a month but this month is worse. In [place] years ago go med for migraine. She can't remember name of med.  She has been experiencing insomnia, waking up around 2:30 to 3:00 AM despite going to bed at 10:00 to 10:30 PM. No recent stress and she maintains her usual routine, including playing with her daughter outside.  Her menstrual history reveals irregularities, with continuous spotting since Aug 15, 2023. She previously experienced a similar pattern from February to April 2025. She was prescribed progesterone , initially taking one capsule daily for ten days, then doubling the dose when bleeding did not cease. Despite this, the spotting persists. She underwent a Pap smear, ultrasound, and biopsy, all reportedly normal.  She has been using Advil  (400 mg) up to three times daily for headache relief, but it is no longer effective. She has also tried Tylenol  for fever but finds it ineffective for pain. She recalls previous migraine treatments in [place], possibly involving controlled medications, but does not remember specifics. She has not used triptans recently.  Her gynecologist checked for anemia two months ago. LMP early may. Still spotting daily. Hx of irregular menses. Being actively managed by gyn who rx'd progesteron recenlty and advised to start yaz when  bleeding stop.    LMP-Early May.    Review of Systems  Constitutional:  Negative for chills and fatigue.  HENT:  Negative for congestion, ear discharge and ear pain.   Respiratory:  Negative for chest tightness, shortness of breath and wheezing.   Cardiovascular:  Negative for chest pain and palpitations.  Gastrointestinal:  Negative for abdominal pain and constipation.  Genitourinary:  Negative for dysuria and flank pain.  Musculoskeletal:  Negative for back pain and neck pain.  Skin:  Negative for rash.  Neurological:  Positive for headaches.  Hematological:  Negative for adenopathy.  Psychiatric/Behavioral:  Positive for sleep disturbance. Negative for behavioral problems and dysphoric mood.     Past Medical History:  Diagnosis Date   Gestational diabetes    Migraines    PCOS (polycystic ovarian syndrome)      Social History   Socioeconomic History   Marital status: Married    Spouse name: Idolina Gu[place]   Number of children: 0   Years of education: 13   Highest education level: 8th grade  Occupational History   Occupation: unemployed  Tobacco Use   Smoking status: Never   Smokeless tobacco: Never  Vaping Use   Vaping status: Never Used  Substance and Sexual Activity   Alcohol use: Yes    Comment: occ   Drug use: No   Sexual activity: Yes    Partners: Male    Birth control/protection: None  Other Topics Concern   Not on file  Social History Narrative   Not on file   Social Drivers of Health  Financial Resource Strain: Patient Declined (09/26/2023)   Overall Financial Resource Strain (CARDIA)    Difficulty of Paying Living Expenses: Patient declined  Food Insecurity: Patient Declined (09/26/2023)   Hunger Vital Sign    Worried About Running Out of Food in the Last Year: Patient declined    Ran Out of Food in the Last Year: Patient declined  Transportation Needs: No Transportation Needs (09/26/2023)   PRAPARE - Scientist, research (physical sciences) (Medical): No    Lack of Transportation (Non-Medical): No  Physical Activity: Insufficiently Active (09/26/2023)   Exercise Vital Sign    Days of Exercise per Week: 4 days    Minutes of Exercise per Session: 30 min  Stress: No Stress Concern Present (09/26/2023)   [employer] of Occupational Health - Occupational Stress Questionnaire    Feeling of Stress: Not at all  Social Connections: Moderately Integrated (09/26/2023)   Social Connection and Isolation Panel    Frequency of Communication with Friends and Family: Twice a week    Frequency of Social Gatherings with Friends and Family: Once a week    Attends Religious Services: More than 4 times per year    Active Member of [employer] or Organizations: No    Attends Engineer, structural: Not on file    Marital Status: Married  Catering manager Violence: Not on file    Past Surgical History:  Procedure Laterality Date   NO PAST SURGERIES      Family History  Problem Relation Age of Onset   Diabetes Mother     Allergies  Allergen Reactions   Cherry Anaphylaxis   Almond (Diagnostic) Itching   Apple    Apple Juice Itching    Red apples cause this more than other varieties of apples   Black [employer] (Non-Screening) Itching    Current Outpatient Medications on File Prior to Visit  Medication Sig Dispense Refill   ACCU-CHEK GUIDE TEST test strip CHECK BLOOD SUGARS TWICE DAILY 100 each 0   Accu-Chek Softclix Lancets lancets CHECK BLOOD SUGARS TWICE DAILY 100 each 0   Blood Glucose Monitoring Suppl (ACCU-CHEK GUIDE) w/Device KIT 1 kit by Does not apply route daily. 1 kit 2   drospirenone -ethinyl estradiol  (YAZ) 3-0.02 MG tablet Take 1 tablet by mouth daily. 84 tablet 3   hydrocortisone  2.5 % ointment Apply to affected areas rash once to twice daily until improved. 30 g 3   hydrOXYzine  (VISTARIL ) 25 MG capsule 1 tab po q hs prn itching 7 capsule 0   ibuprofen  (ADVIL ) 600 MG tablet Take 1 tablet  (600 mg total) by mouth every 6 (six) hours. 30 tablet 0   metFORMIN  (GLUCOPHAGE ) 500 MG tablet TAKE 1 TABLET BY MOUTH TWICE DAILY WITH A MEAL 60 tablet 0   minoxidil  (LONITEN ) 2.5 MG tablet Take 1 tablet by mouth every day. 30 tablet 4   progesterone  (PROMETRIUM ) 100 MG capsule TAKE 1 CAPSULE BY MOUTH ONCE DAILY **TAKE NIGHTLY TO STOP BLEEDING THEN WHEN PERIOD STOPS, PLEASE BEGIN YAZ** 20 capsule 0   spironolactone  (ALDACTONE ) 100 MG tablet Take 1 tablet (100 mg total) by mouth daily. 30 tablet 4   VITAMIN D  PO Take by mouth.     No current facility-administered medications on file prior to visit.    BP 122/84   Pulse 79   Temp 98.4 F (36.9 C) (Oral)   Resp 18   Ht 5' (1.524 m)   Wt 151 lb 9.6 oz (68.8 kg)  SpO2 97%   BMI 29.61 kg/m         Objective:   Physical Exam  General Mental Status- Alert. General Appearance- Not in acute distress.     Neck No JVD. Mild trapezius tenderness. No light sensivtivy. No neck stiffness.  Eyes- peerl bilateral. No light sensitivity.   Chest and Lung Exam Auscultation: Breath Sounds:-Normal.  Cardiovascular Auscultation:Rythm- Regular. Murmurs & Other Heart Sounds:Auscultation of the heart reveals- No Murmurs.  Abdomen Inspection:-Inspeection Normal. Palpation/Percussion:Note:No mass. Palpation and Percussion of the abdomen reveal- Non Tender, Non Distended + BS, no rebound or guarding.    Neurologic Cranial Nerve exam:- CN III-XII intact(No nystagmus), symmetric smile. Drift Test:- No drift. Romberg Exam:- Negative.  Heal to Toe Gait exam:-Normal. Finger to Nose:- Normal/Intact Strength:- 5/5 equal and symmetric strength both upper and lower extremities.       Assessment & Plan:   Patient Instructions   Migraine ha. Chronic migraine suspected due to prolonged duration and symptoms unresponsive to current medications. - Administered Toradol injection 60 mg for acute pain relief. - Monitor response to Toradol and  report via MyChart by tomorrow. -  Imitrex (sumatriptan) for future episodes, taken at onset, repeat after two hours if needed. - Advised emergency care if headache worsens or persists. - Schedule follow-up in 10 days. - Consider neurologist referral or head CT if symptoms persist.  Insomnia Insomnia likely related to migraine severity and stress from irregular bleeding. -trazadone rx for insominia  Abnormal Uterine Bleeding with some fatigue Persistent irregular bleeding despite double dose progesterone . - Continue current progesterone  regimen as prescribed. - Contact gynecologist for earlier appointment if bleeding persists within a week. - cbc and iron panel today  Follow up 10 days or sooner if needed

## 2023-10-03 NOTE — Addendum Note (Signed)
 Addended by: DORLENE CHIQUITA RAMAN on: 10/03/2023 11:42 AM   Modules accepted: Orders

## 2023-10-03 NOTE — Patient Instructions (Addendum)
 Migraine ha. Chronic migraine suspected due to prolonged duration and symptoms unresponsive to current medications. - Administered Toradol injection 60 mg IM for acute pain relief. - Monitor response to Toradol and report via MyChart by tomorrow. -  Imitrex (sumatriptan) for future episodes, taken at onset, repeat after two hours if needed. - Advised emergency care if headache worsens or persists. - Schedule follow-up in 10 days. - Consider neurologist referral or head CT if symptoms persist.  Insomnia Insomnia likely related to migraine severity and stress from irregular bleeding. -trazadone rx for insominia  Abnormal Uterine Bleeding with some fatigue Persistent irregular bleeding despite double dose progesterone . - Continue current progesterone  regimen as prescribed. - Contact gynecologist for earlier appointment if bleeding persists within a week. - cbc and iron panel today  Follow up 10 days or sooner if needed

## 2023-10-04 ENCOUNTER — Encounter: Payer: Self-pay | Admitting: Obstetrics and Gynecology

## 2023-10-04 ENCOUNTER — Encounter: Payer: Self-pay | Admitting: Medical

## 2023-10-04 LAB — IRON,TIBC AND FERRITIN PANEL
%SAT: 20 % (ref 16–45)
Ferritin: 27 ng/mL (ref 16–154)
Iron: 89 ug/dL (ref 40–190)
TIBC: 443 ug/dL (ref 250–450)

## 2023-10-04 NOTE — Telephone Encounter (Signed)
 Renda or Will -can you translate?

## 2023-10-05 NOTE — Telephone Encounter (Signed)
 Progesterone  medication did not help with bleeding.  Doubled the dosage for the medication for 10 days but still bled.  Also was told after bleeding stopped would start on YAS but like mentioned bleeding did not stop.

## 2023-10-13 NOTE — Telephone Encounter (Signed)
 Call placed to patient using [employer] ID [phone].   Took Prometrium  200 mg PO daily on for 10 days days, started on approximately 09/22/23.  Taking OCP Loryna , missed several pills the first month and second month. Did not stop OCP when taking Prometrium .   Bleeding stopped on 10/12/23. Some spotting today. Reports intermittent menses like cramps, denies any other symptoms.   Has not taken UPT, will take UPT to r/o pregnancy.   Has had 2 full month of taking OCP with no missed or late pills.  Advised of importance of taking OCP daily no missed or late pills. Advised if bleeding continues or becomes heavy or any new symptoms develop, return call to office.   I will provide update to Dr. Glennon, our office will return call if any additional recommendations. Patient agreeable.    Routing to Dr. Glennon to review

## 2023-10-13 NOTE — Telephone Encounter (Signed)
 Call placed to patient using [employer] ID# [phone] x2, no answer, no voicemail.

## 2023-10-14 ENCOUNTER — Ambulatory Visit: Admitting: Medical

## 2023-10-14 VITALS — BP 128/88 | HR 84 | Temp 98.1°F | Resp 18 | Ht 60.0 in | Wt 150.0 lb

## 2023-10-14 DIAGNOSIS — Z7984 Long term (current) use of oral hypoglycemic drugs: Secondary | ICD-10-CM | POA: Diagnosis not present

## 2023-10-14 DIAGNOSIS — N926 Irregular menstruation, unspecified: Secondary | ICD-10-CM

## 2023-10-14 DIAGNOSIS — E119 Type 2 diabetes mellitus without complications: Secondary | ICD-10-CM

## 2023-10-14 DIAGNOSIS — G43801 Other migraine, not intractable, with status migrainosus: Secondary | ICD-10-CM | POA: Diagnosis not present

## 2023-10-14 MED ORDER — KETOROLAC TROMETHAMINE 30 MG/ML IJ SOLN
30.0000 mg | Freq: Once | INTRAMUSCULAR | Status: AC
  Administered 2023-10-14: 30 mg via INTRAMUSCULAR

## 2023-10-14 NOTE — Progress Notes (Signed)
 Subjective:    Patient ID: Adriana Hahn, female    DOB: 01-22-1991, 33 y.o.   MRN: [id number]  HPI  Adriana Hahn is a 33 year old female with polycystic ovary syndrome who presents with migraine headaches and irregular menstruation.  She experiences migraine headaches that are not well controlled. She takes Imitrex  for her migraines, but each time she takes it, she experiences body aches and sneezing. The body aches are described as a pressure sensation, similar to having her blood pressure taken, affecting her shoulders and head. She has taken Imitrex  three times, and each time she experienced these side effects. Despite these side effects, the medication helps reduce her headache pain but no completely. Her last severe migraine occurred two days ago, and she did not experience vomiting but was sensitive to noise. Currently, her headache is persistenting but much less.  She has a history of irregular menstruation, which began around the age of 29 or 58 after being diagnosed with polycystic ovary syndrome. She has been on fertility treatment . Recently, she experienced prolonged spotting from May 12th until the end of June, with her last menstrual period starting on July 1st and ending on July 9th. She was prescribed progesterone  by her gyn. She is being actively followed by gyn. Infertility tx in past did lead to pregnancy and she has one child.  She is currently taking metformin  for her blood sugar management, which she takes twice a day on some days and once a day on others, as she feels her body has adapted to this regimen. Her last A1c check showed an average blood sugar level of 135.     Review of Systems  Constitutional:  Negative for chills and fatigue.  Respiratory:  Negative for cough, chest tightness and wheezing.   Cardiovascular:  Negative for chest pain and palpitations.  Gastrointestinal:  Negative for abdominal pain.  Genitourinary:        Irregular menses  Musculoskeletal:   Negative for back pain.  Neurological:  Positive for headaches. Negative for dizziness and light-headedness.  Hematological:  Negative for adenopathy. Does not bruise/bleed easily.  Psychiatric/Behavioral:  Negative for behavioral problems and decreased concentration.        Objective:   Physical Exam  General Mental Status- Alert. General Appearance- Not in acute distress.   Skin General: Color- Normal Color. Moisture- Normal Moisture.  Neck Carotid Arteries- Normal color. Moisture- Normal Moisture. No carotid bruits. No JVD.  Chest and Lung Exam Auscultation: Breath Sounds:-Normal.  Cardiovascular Auscultation:Rythm- Regular. Murmurs & Other Heart Sounds:Auscultation of the heart reveals- No Murmurs.  Abdomen Inspection:-Inspeection Normal. Palpation/Percussion:Note:No mass. Palpation and Percussion of the abdomen reveal- Non Tender, Non Distended + BS, no rebound or guarding.    Neurologic Cranial Nerve exam:- CN III-XII intact(No nystagmus), symmetric smile. Drift Test:- No drift. Romberg Exam:- Negative.  Heal to Toe Gait exam:-Normal. Finger to Nose:- Normal/Intact Strength:- 5/5 equal and symmetric strength both upper and lower extremities.       Assessment & Plan:   Patient Instructions  Migraine Migraines poorly controlled with Imitrex , causing side effects. Severe episode possibly triggered by heat. Neurology referral made for further management. - Refer to neurology for further evaluation and management. - Offer Toradol  30 mg injection for acute relief if needed. - Advise ibuprofen  for mild HA. - Instruct to return for Toradol  if ibuprofen  is ineffective and pain is severe.  Type 2 Diabetes Mellitus Diabetes managed with metformin . Last A1c showed controlled levels. Ordered A1c and metabolic  panel for current assessment. - Order A1c and metabolic panel to assess current control.  Irregular Menstruation Irregular menstruation due to PCOS. Recent  prolonged spotting resolved. Gynecologist advised continuing birth control pills. - Continue communication with gynecologist. - Follow gynecologist's advice to continue birth control pills.  Follow up in 3 months or sooner if needed    [employer], PA-C

## 2023-10-14 NOTE — Patient Instructions (Signed)
 Migraine Migraines poorly controlled with Imitrex , causing side effects. Severe episode possibly triggered by heat. Neurology referral made for further management. - Refer to neurology for further evaluation and management. - Offer Toradol  30 mg injection for acute relief if needed. - Advise ibuprofen  for mild HA. - Instruct to return for Toradol  if ibuprofen  is ineffective and pain is severe.  Type 2 Diabetes Mellitus Diabetes managed with metformin . Last A1c showed controlled levels. Ordered A1c and metabolic panel for current assessment. - Order A1c and metabolic panel to assess current control.  Irregular Menstruation Irregular menstruation due to PCOS. Recent prolonged spotting resolved. Gynecologist advised continuing birth control pills. - Continue communication with gynecologist. - Follow gynecologist's advice to continue birth control pills.  Follow up in 3 months or sooner if needed

## 2023-10-15 ENCOUNTER — Ambulatory Visit: Payer: Self-pay | Admitting: Medical

## 2023-10-15 LAB — COMPREHENSIVE METABOLIC PANEL WITH GFR
AG Ratio: 1.6 (calc) (ref 1.0–2.5)
ALT: 12 U/L (ref 6–29)
AST: 14 U/L (ref 10–30)
Albumin: 4.6 g/dL (ref 3.6–5.1)
Alkaline phosphatase (APISO): 61 U/L (ref 31–125)
BUN: 13 mg/dL (ref 7–25)
CO2: 23 mmol/L (ref 20–32)
Calcium: 9.7 mg/dL (ref 8.6–10.2)
Chloride: 102 mmol/L (ref 98–110)
Creat: 0.72 mg/dL (ref 0.50–0.97)
Globulin: 2.8 g/dL (ref 1.9–3.7)
Glucose, Bld: 99 mg/dL (ref 65–99)
Potassium: 4.4 mmol/L (ref 3.5–5.3)
Sodium: 136 mmol/L (ref 135–146)
Total Bilirubin: 0.3 mg/dL (ref 0.2–1.2)
Total Protein: 7.4 g/dL (ref 6.1–8.1)
eGFR: 114 mL/min/1.73m2 (ref >=60)

## 2023-10-15 LAB — HEMOGLOBIN A1C
Hgb A1c MFr Bld: 6.5 % — ABNORMAL HIGH (ref <5.7)
Mean Plasma Glucose: 140 mg/dL
eAG (mmol/L): 7.7 mmol/L

## 2023-10-15 MED ORDER — METFORMIN HCL 500 MG PO TABS
500.0000 mg | ORAL_TABLET | Freq: Two times a day (BID) | ORAL | 3 refills | Status: AC
Start: 2023-10-15 — End: ?

## 2023-10-19 ENCOUNTER — Other Ambulatory Visit: Payer: Self-pay | Admitting: Medical

## 2023-10-25 ENCOUNTER — Ambulatory Visit: Payer: Medicaid Other | Admitting: Dermatology

## 2023-10-25 DIAGNOSIS — L649 Androgenic alopecia, unspecified: Secondary | ICD-10-CM

## 2023-10-25 DIAGNOSIS — Z7189 Other specified counseling: Secondary | ICD-10-CM

## 2023-10-25 DIAGNOSIS — L300 Nummular dermatitis: Secondary | ICD-10-CM | POA: Diagnosis not present

## 2023-10-25 DIAGNOSIS — Z79899 Other long term (current) drug therapy: Secondary | ICD-10-CM | POA: Diagnosis not present

## 2023-10-25 MED ORDER — MINOXIDIL 2.5 MG PO TABS: ORAL_TABLET | ORAL | 1 refills | Status: AC

## 2023-10-25 NOTE — Patient Instructions (Addendum)
 Doses of oral minoxidil  for hair loss are considered 'low dose'. This is because the doses used for hair loss are much lower than the doses which are used for conditions such as high blood pressure (hypertension). The doses used for hypertension are 10-40mg  per day.  Side effects are uncommon at the low doses (up to 2.5 mg/day) used to treat hair loss. Potential side effects, more commonly seen at higher doses, include: Increase in hair growth (hypertrichosis) elsewhere on face and body Temporary hair shedding upon starting medication which may last up to 4 weeks Ankle swelling, fluid retention, rapid weight gain more than 5 pounds Low blood pressure and feeling lightheaded or dizzy when standing up quickly Fast or irregular heartbeat Headaches    Due to recent changes in healthcare laws, you may see results of your pathology and/or laboratory studies on MyChart before the doctors have had a chance to review them. We understand that in some cases there may be results that are confusing or concerning to you. Please understand that not all results are received at the same time and often the doctors may need to interpret multiple results in order to provide you with the best plan of care or course of treatment. Therefore, we ask that you please give us  2 business days to thoroughly review all your results before contacting the office for clarification. Should we see a critical lab result, you will be contacted sooner.   If You Need Anything After Your Visit  If you have any questions or concerns for your doctor, please call our main line at [phone] and press option 4 to reach your doctor's medical assistant. If no one answers, please leave a voicemail as directed and we will return your call as soon as possible. Messages left after 4 pm will be answered the following business day.   You may also send us  a message via MyChart. We typically respond to MyChart messages within 1-2 business  days.  For prescription refills, please ask your pharmacy to contact our office. Our fax number is [phone].  If you have an urgent issue when the clinic is closed that cannot wait until the next business day, you can page your doctor at the number below.    Please note that while we do our best to be available for urgent issues outside of office hours, we are not available 24/7.   If you have an urgent issue and are unable to reach us , you may choose to seek medical care at your doctor's office, retail clinic, urgent care center, or emergency room.  If you have a medical emergency, please immediately call 911 or go to the emergency department.  Pager Numbers  - Dr. Hester: [phone]  - Dr. Jackquline: [phone]  - Dr. Claudene: [phone]   In the event of inclement weather, please call our main line at [phone] for an update on the status of any delays or closures.  Dermatology Medication Tips: Please keep the boxes that topical medications come in in order to help keep track of the instructions about where and how to use these. Pharmacies typically print the medication instructions only on the boxes and not directly on the medication tubes.   If your medication is too expensive, please contact our office at [phone] option 4 or send us  a message through MyChart.   We are unable to tell what your co-pay for medications will be in advance as this is different depending on your insurance coverage. However, we may  be able to find a substitute medication at lower cost or fill out paperwork to get insurance to cover a needed medication.   If a prior authorization is required to get your medication covered by your insurance company, please allow us  1-2 business days to complete this process.  Drug prices often vary depending on where the prescription is filled and some pharmacies may offer cheaper prices.  The website www.goodrx.com contains coupons for medications  through different pharmacies. The prices here do not account for what the cost may be with help from insurance (it may be cheaper with your insurance), but the website can give you the price if you did not use any insurance.  - You can print the associated coupon and take it with your prescription to the pharmacy.  - You may also stop by our office during regular business hours and pick up a GoodRx coupon card.  - If you need your prescription sent electronically to a different pharmacy, notify our office through [facility] or by phone at [phone] option 4.     Si Usted Necesita Algo Despus de Su Visita  Tambin puede enviarnos un mensaje a travs de Clinical cytogeneticist. Por lo general respondemos a los mensajes de MyChart en el transcurso de 1 a 2 das hbiles.  Para renovar recetas, por favor pida a su farmacia que se ponga en contacto con nuestra oficina. Randi lakes de fax es [place] [phone].  Si tiene un asunto urgente cuando la clnica est cerrada y que no puede esperar hasta el siguiente da hbil, puede llamar/localizar a su doctor(a) al nmero que aparece a continuacin.   Por favor, tenga en cuenta que aunque hacemos todo lo posible para estar disponibles para asuntos urgentes fuera del horario de [place], no estamos disponibles las 24 horas del da, los 7 [address]  [address] de la [place].   Si tiene un problema urgente y no puede comunicarse con nosotros, puede optar por buscar atencin mdica  en el consultorio de su doctor(a), en una clnica privada, en un centro de atencin urgente o en una sala de emergencias.  Si tiene Engineer, drilling, por favor llame inmediatamente al 911 o vaya a la sala de emergencias.  Nmeros de bper  - Dr. Hester: [phone]  - Dra. Jackquline: [phone]  - Dr. Claudene: [phone]   En caso de inclemencias del tiempo, por favor llame a landry capes principal al [phone] para una actualizacin sobre el [place] de cualquier retraso o  cierre.  Consejos para la medicacin en dermatologa: Por favor, guarde las cajas en las que vienen los medicamentos de uso tpico para ayudarle a seguir las instrucciones sobre dnde y cmo usarlos. Las farmacias generalmente imprimen las instrucciones del medicamento slo en las cajas y no directamente en los tubos del [place].   Si su medicamento es muy caro, por favor, pngase en contacto con landry rieger llamando al [phone] y presione la opcin 4 o envenos un mensaje a travs de Clinical cytogeneticist.   No podemos decirle cul ser su copago por los medicamentos por adelantado ya que esto es diferente dependiendo de la cobertura de su seguro. Sin embargo, es posible que podamos encontrar un medicamento sustituto a Audiological scientist un formulario para que el seguro cubra el medicamento que se considera necesario.   Si se requiere una autorizacin previa para que su compaa de seguros malta su medicamento, por favor permtanos de 1 a 2 das hbiles para completar este proceso.  Los precios de los [employer]  varan con frecuencia dependiendo del lugar de dnde se surte la receta y alguna farmacias pueden ofrecer precios ms baratos.  El sitio web www.goodrx.com tiene cupones para medicamentos de Health and safety inspector. Los precios aqu no tienen en cuenta lo que podra costar con la ayuda del seguro (puede ser ms barato con su seguro), pero el sitio web puede darle el precio si no utiliz Tourist information centre manager.  - Puede imprimir el cupn correspondiente y llevarlo con su receta a la farmacia.  - Tambin puede pasar por nuestra oficina durante el horario de atencin regular y Education officer, museum una tarjeta de cupones de GoodRx.  - Si necesita que su receta se enve electrnicamente a una farmacia diferente, informe a nuestra oficina a travs de MyChart de [place] o por telfono llamando al [phone] y presione la opcin 4.

## 2023-10-25 NOTE — Progress Notes (Signed)
 Follow-Up Visit   Subjective  Adriana Hahn is a 33 y.o. female who presents for the following: 6 month follow-up Androgenetic Alopecia, taking Spironolactone  100 MG daily and Minoxidil  2.5 MG daily. Hair is not shedding as much. Patient took Vitamin D  for 4 months after last visit. Patient was having irregular periods, so her doctor put her on birth control pills.  She has Nummular Dermatitis flares off and on, uses hydrocortisone  2.5% ointment and Palmer's Cream. Doing better.   The following portions of the chart were reviewed this encounter and updated as appropriate: medications, allergies, medical history  Review of Systems:  No other skin or systemic complaints except as noted in HPI or Assessment and Plan.  Objective  Well appearing patient in no apparent distress; mood and affect are within normal limits.  A focused examination was performed of the following areas: Face, scalp, arms, legs  Relevant physical exam findings are noted in the Assessment and Plan.    Assessment & Plan  ANDROGENETIC ALOPECIA (FEMALE PATTERN HAIR LOSS) Exam: Diffuse thinning of the crown, improved, and narrowing of the midline part compared to photos 11/16/2022.  Chronic and persistent condition with duration or expected duration over one year. Condition is improving with treatment but not currently at goal.   Female Androgenic Alopecia is a chronic condition related to genetics and/or hormonal changes.  In women androgenetic alopecia is commonly associated with menopause but may occur any time after puberty.  It causes hair thinning primarily on the crown with widening of the part and temporal hairline recession.  Can use OTC Rogaine  (minoxidil ) 5% solution/foam as directed.  Oral treatments in female patients who have no contraindication may include : - Low dose oral minoxidil  1.25 - 5mg  daily - Spironolactone  50 - 100mg  bid - Finasteride 2.5 - 5 mg daily Adjunctive therapies include: - Low  Level Laser Light Therapy (LLLT) - Platelet-rich plasma injections (PRP) - Hair Transplants or scalp reduction   Treatment Plan: BP 113/78 Hair growth has improved, so patient does not need to continue Vitamin D .  Continue Minoxidil  2.5 MG 1 po every day.   Patient to d/c spironolactone  as she is trying to conceive since it can cause irregular periods why may decrease fertility  Also should not take during pregnancy due to risk birth defects.  Patient should not take minoxidil  while pregnant or breastfeeding.   Doses of minoxidil  for hair loss are considered 'low dose'. This is because the doses used for hair loss are much lower than the doses which are used for conditions such as high blood pressure (hypertension). The doses used for hypertension are 10-40mg  per day.  Side effects are uncommon at the low doses (up to 2.5 mg/day) used to treat hair loss. Potential side effects, more commonly seen at higher doses, include: Increase in hair growth (hypertrichosis) elsewhere on face and body Temporary hair shedding upon starting medication which may last up to 4 weeks Ankle swelling, fluid retention, rapid weight gain more than 5 pounds Low blood pressure and feeling lightheaded or dizzy when standing up quickly Fast or irregular heartbeat Headaches    Spironolactone  can cause increased urination and cause blood pressure to decrease. Please watch for signs of lightheadedness and be cautious when changing position. It can sometimes cause breast tenderness or an irregular period in premenopausal women. It can also increase potassium. The increase in potassium usually is not a concern unless you are taking other medicines that also increase potassium, so please be sure  your doctor knows all of the other medications you are taking. This medication should not be taken by pregnant women.  This medicine should also not be taken together with sulfa  drugs like Bactrim  (trimethoprim /sulfamethexazole).     Long term medication management.  Patient is using long term (months to years) prescription medication  to control their dermatologic condition.  These medications require periodic monitoring to evaluate for efficacy and side effects and may require periodic laboratory monitoring.    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 Currently well-controlled.   Nummular dermatitis (eczema) is a chronic, relapsing, itchy rash that can significantly affect quality of life. It is often associated with dry skin and flares in the wintertime, and may require treatment with prescription topical anti-inflammatory medications, in addition to gentle skin care.  If there is associated atopic dermatitis and topicals are not working, then biologic injections may be necessary to clear rash and control symptoms.  Treatment Plan: Continue hydrocortisone  2.5% to ointment (cream burns) Apply once to twice daily to affected areas rash until improved Recommend mild soap and moisturizing cream 1-2 times daily.  Gentle skin care handout provided.  Continue Dove soap and Palmer's Cream (pt thinks this helps more than Eucerin)  Recommend mild soap and moisturizing cream 1-2 times daily.  Gentle skin care handout provided.     Return in about 6 months (around 04/26/2024) for Alopecia, Nummular Derm.  Adriana Hahn, CMA, am acting as scribe for Rexene Rattler, MD .   Documentation: I have reviewed the above documentation for accuracy and completeness, and I agree with the above.  Rexene Rattler, MD

## 2023-12-02 ENCOUNTER — Ambulatory Visit: Admitting: Neurology

## 2023-12-02 ENCOUNTER — Encounter: Payer: Self-pay | Admitting: Neurology

## 2023-12-02 VITALS — BP 114/78 | HR 73 | Ht 62.0 in | Wt 150.0 lb

## 2023-12-02 DIAGNOSIS — R519 Headache, unspecified: Secondary | ICD-10-CM

## 2023-12-02 DIAGNOSIS — H539 Unspecified visual disturbance: Secondary | ICD-10-CM | POA: Diagnosis not present

## 2023-12-02 DIAGNOSIS — R51 Headache with orthostatic component, not elsewhere classified: Secondary | ICD-10-CM

## 2023-12-02 DIAGNOSIS — G43009 Migraine without aura, not intractable, without status migrainosus: Secondary | ICD-10-CM | POA: Diagnosis not present

## 2023-12-02 MED ORDER — NORTRIPTYLINE HCL 25 MG PO CAPS: 25.0000 mg | ORAL_CAPSULE | Freq: Every day | ORAL | 6 refills | Status: AC

## 2023-12-02 NOTE — Progress Notes (Signed)
 [facility]    Provider:  Dr Ines Requesting Provider: Dorina Dallas RIGGERS Primary Care Provider:  Dorina Dallas, PA-C  CC:  Migraines  HPI:  Adriana Hahn is a 33 y.o. female here as requested by Dorina Dallas, PA-C for migraines. has Infertility, female; PCOS (polycystic ovarian syndrome); ASCUS of cervix with negative high risk HPV; Language barrier affecting health care; History of gestational diabetes; History of pre-eclampsia; and Migraine without aura and without status migrainosus, not intractable on their problem list.  [name] interpreter: Spanish speaking, entire visit completed via interpreter.  Started when she was 24. She does not feel anystress. Just starts out of nowhere. Certain foods like red meat can trigger, chocolate triggers, orange juice triggers, Endorses Pulsating/pounding/throbbing, light and sound sensitivity, nausea, vomiting, hurts to move, migraines are moderate to severe and can last 8-72 hours or more, unilateral but can spread to be holocephalic. They are significantly affecting quality of life with work, family. She has vision changes, smells can make it worse, can happen in the morning and be positionally worse, migraines were awful last month, they are worsening in severity, can last up to 3 days at a time, worsening  she has 10 total headache days a month on average and 6 total migrane days a month on average for the last > 6 months, no medication overuse, no aura. She does not remember any other medications she was tried on.worsening acutely, becoming more severe in pain moderate to severe and lasting longer.  No other focal neurologic deficits, associated symptoms, inciting events or modifiable factors.  Reviewed notes, labs and imaging from outside physicians, which showed:  Reviewed labs: 10/14/2023 cmp nml, hgba1c 6.5 abnormal almost diabetic advised to follow with pcp; 07/05/2023 tsh nml 0.73  From a thorough review of records  and patient report, Medications tried that can be used in migraine/headache management greater than 3 months include: Lifestyle modification, headache diaries, better sleep hygiene, exercise, management of migraine triggers, OTC and prescribed analgesics/nsaids such as ibuprofen , excedrin, alleve and others, fioricet , sumatriptan , trazodone , maxalt, nortriptyline , blood pressure medications contraindicated due to hypotension.   Review of Systems: Patient complains of symptoms per HPI as well as the following symptoms none/per hpi. Pertinent negatives and positives per HPI. All others negative.   Social History   Socioeconomic History   Marital status: Married    Spouse name: Idolina [place]   Number of children: 0   Years of education: 13   Highest education level: 8th grade  Occupational History   Occupation: unemployed  Tobacco Use   Smoking status: Never   Smokeless tobacco: Never  Vaping Use   Vaping status: Never Used  Substance and Sexual Activity   Alcohol use: Yes    Comment: occ   Drug use: No   Sexual activity: Yes    Partners: Male    Birth control/protection: None  Other Topics Concern   Not on file  Social History Narrative   Not on file   Social Drivers of Health   Financial Resource Strain: Patient Declined (09/26/2023)   Overall Financial Resource Strain (CARDIA)    Difficulty of Paying Living Expenses: Patient declined  Food Insecurity: Patient Declined (09/26/2023)   Hunger Vital Sign    Worried About Running Out of Food in the Last Year: Patient declined    Ran Out of Food in the Last Year: Patient declined  Transportation Needs: No Transportation Needs (09/26/2023)   PRAPARE - Administrator, Civil Service (Medical):  No    Lack of Transportation (Non-Medical): No  Physical Activity: Insufficiently Active (09/26/2023)   Exercise Vital Sign    Days of Exercise per Week: 4 days    Minutes of Exercise per Session: 30 min  Stress: No Stress  Concern Present (09/26/2023)   [employer] of Occupational Health - Occupational Stress Questionnaire    Feeling of Stress: Not at all  Social Connections: Moderately Integrated (09/26/2023)   Social Connection and Isolation Panel    Frequency of Communication with Friends and Family: Twice a week    Frequency of Social Gatherings with Friends and Family: Once a week    Attends Religious Services: More than 4 times per year    Active Member of [employer] or Organizations: No    Attends Engineer, structural: Not on file    Marital Status: Married  Catering manager Violence: Not on file    Family History  Problem Relation Age of Onset   Diabetes Mother     Past Medical History:  Diagnosis Date   Gestational diabetes    Migraines    PCOS (polycystic ovarian syndrome)     Patient Active Problem List   Diagnosis Date Noted   Migraine without aura and without status migrainosus, not intractable 12/05/2023   History of pre-eclampsia 07/02/2019   History of gestational diabetes 05/28/2019   Language barrier affecting health care 03/26/2019   PCOS (polycystic ovarian syndrome) 12/08/2018   ASCUS of cervix with negative high risk HPV 12/08/2018   Infertility, female 07/12/2016    Past Surgical History:  Procedure Laterality Date   NO PAST SURGERIES      Current Outpatient Medications  Medication Sig Dispense Refill   ACCU-CHEK GUIDE TEST test strip CHECK BLOOD SUGAR TWICE A DAY 100 each 0   Accu-Chek Softclix Lancets lancets CHECK BLOOD SUGARS TWICE DAILY 100 each 0   Blood Glucose Monitoring Suppl (ACCU-CHEK GUIDE) w/Device KIT 1 kit by Does not apply route daily. 1 kit 2   drospirenone -ethinyl estradiol  (YAZ) 3-0.02 MG tablet Take 1 tablet by mouth daily. 84 tablet 3   hydrocortisone  2.5 % ointment Apply to affected areas rash once to twice daily until improved. 30 g 3   ibuprofen  (ADVIL ) 600 MG tablet Take 1 tablet (600 mg total) by mouth every 6 (six) hours. 30  tablet 0   metFORMIN  (GLUCOPHAGE ) 500 MG tablet Take 1 tablet (500 mg total) by mouth 2 (two) times daily with a meal. 180 tablet 3   minoxidil  (LONITEN ) 2.5 MG tablet Take 1 tablet by mouth every day. 90 tablet 1   nortriptyline  (PAMELOR ) 25 MG capsule Take 1 capsule (25 mg total) by mouth at bedtime. 30 capsule 6   progesterone  (PROMETRIUM ) 100 MG capsule TAKE 1 CAPSULE BY MOUTH ONCE DAILY **TAKE NIGHTLY TO STOP BLEEDING THEN WHEN PERIOD STOPS, PLEASE BEGIN YAZ** 20 capsule 0   Rimegepant Sulfate (NURTEC) 75 MG TBDP Take 1 tablet (75 mg total) by mouth daily as needed. For migraines. Take as close to onset of migraine as possible. One daily maximum. 16 tablet 11   No current facility-administered medications for this visit.    Allergies as of 12/02/2023 - Review Complete 12/02/2023  Allergen Reaction Noted   Cherry Anaphylaxis 05/28/2019   Almond (diagnostic) Itching 03/26/2019   Apple  02/24/2022   Apple juice Itching 05/28/2019   Black walnut flavoring agent (non-screening) Itching 03/26/2019    Vitals: BP 114/78   Pulse 73   Ht 5' 2 (1.575  m)   Wt 150 lb (68 kg)   SpO2 96%   BMI 27.44 kg/m  Last Weight:  Wt Readings from Last 1 Encounters:  12/02/23 150 lb (68 kg)   Last Height:   Ht Readings from Last 1 Encounters:  12/02/23 5' 2 (1.575 m)     Physical exam: Exam: Gen: NAD, conversant, well nourised, obese, well groomed                     CV: RRR, no MRG. No Carotid Bruits. No peripheral edema, warm, nontender Eyes: Conjunctivae clear without exudates or hemorrhage  Neuro: Detailed Neurologic Exam  Speech:    Speech is normal; fluent and spontaneous with normal comprehension.  Cognition:    The patient is oriented to person, place, and time;     recent and remote memory intact;     language fluent;     normal attention, concentration,     fund of knowledge Cranial Nerves:    The pupils are equal, round, and reactive to light. The fundi are normal and  spontaneous venous pulsations are present. Visual fields are full to finger confrontation. Extraocular movements are intact. Trigeminal sensation is intact and the muscles of mastication are normal. The face is symmetric. The palate elevates in the midline. Hearing intact. Voice is normal. Shoulder shrug is normal. The tongue has normal motion without fasciculations.   Coordination: nml  Gait: nml  Motor Observation:    No asymmetry, no atrophy, and no involuntary movements noted. Tone:    Normal muscle tone.    Posture:    Posture is normal. normal erect    Strength:    Strength is V/V in the upper and lower limbs.      Sensation: intact to LT     Reflex Exam:  DTR's:    Deep tendon reflexes in the upper and lower extremities are normal bilaterally.   Toes:    The toes are downgoing bilaterally.   Clonus:    Clonus is absent.    Assessment/Plan:  Patient with migraines  Cont use topiramate, not consistently using birth control and may come off her obc and does not use condoms, encouraged her to not get pregnant while on nurtec, will start her on nortriptyline : discussed that Nortriptyline  is considered low risk in pregnancy but no medication is entirely safe. There have been rare associations with Nortriptyline  and limb anomalies and cardiac anomalies and other birth defects  Start Nortriptyline  for migraine prevention: discussed that Nortriptyline  is considered low risk in pregnancy but no medication is entirely safe. There have been rare associations with Nortriptyline  and limb anomalies and cardiac anomalies and other birth defects  Nurtec as needed once daily for migraines: stop for pregnancy, do not get pregnant on this medication MRI brain w/wo contrast : MRI brain due to concerning symptoms of morning headaches, positional and exertional headaches,vision changes, worsening headaches  to look for space occupying mass, chiari or intracranial hypertension (pseudotumor),  strokes, malignancies, vasculidities, demyelination(multiple sclerosis) or other: Currently NOT pregnant Next would try topiramate, would ensure patient is using birth control. Discussed possible teratogenicity of nurtec/nortriptyline , no medication is entirely safe in pregnancy If she were trying to get pregnant, topiramate would be contraindicated as would be the cgrp medications, I would suggest botox for migraine  Orders Placed This Encounter  Procedures   MR BRAIN W WO CONTRAST   Meds ordered this encounter  Medications   nortriptyline  (PAMELOR ) 25 MG capsule  Sig: Take 1 capsule (25 mg total) by mouth at bedtime.    Dispense:  30 capsule    Refill:  6   Rimegepant Sulfate (NURTEC) 75 MG TBDP    Sig: Take 1 tablet (75 mg total) by mouth daily as needed. For migraines. Take as close to onset of migraine as possible. One daily maximum.    Dispense:  16 tablet    Refill:  11    Cc: Saguier, Edward, PA-C,  Saguier, [place], PA-C  Onetha Epp, MD  [facility] [address] [place], [place] [address]  Phone [phone] Fax [phone]

## 2023-12-02 NOTE — Patient Instructions (Addendum)
 Start Nortriptyline  for migraine prevention Nurtec as needed once daily for migraines MRI brain w/wo contrast   Rimegepant Disintegrating Tablets Qu es este medicamento? El RIMEGEPANT previene y trata las migraas. Acta bloqueando una sustancia en el cuerpo que causa migraas. Este medicamento puede ser utilizado para otros usos; si tiene alguna pregunta consulte con su proveedor de atencin mdica o con su farmacutico. MARCAS COMUNES: NURTEC ODT Qu le debo informar a mi profesional de la salud antes de tomar este medicamento? Necesitan saber si usted presenta alguno de los siguientes problemas o situaciones: Enfermedad renal Enfermedad heptica Una reaccin alrgica o inusual al rimegepant, a otros medicamentos, alimentos, colorantes o conservantes Si est embarazada o buscando quedar embarazada Si est amamantando a un beb Cmo debo utilizar este medicamento? Tome este medicamento por va oral. Use el medicamento segn las instrucciones en la etiqueta del medicamento. Deje la tableta en el empaque sellado hasta que est listo para tomarla. Abra el empaque sellado con las manos secas y saque la tableta con cuidado. Si la tableta se rompe o se deshace, deschela. Use una tableta nueva. Ponga la tableta en la boca y deje que se disuelva. Luego, trguela. No corte, triture ni [employer]. No necesita agua para tomar [employer]. Hable con su equipo de atencin sobre el uso de este medicamento en nios. Puede requerir atencin especial. Sobredosis: Pngase en contacto inmediatamente con un centro toxicolgico o una sala de urgencia si usted cree que haya tomado demasiado medicamento.<br>ATENCIN: [employer] es solo para usted. No comparta este medicamento con nadie. Qu sucede si me olvido de una dosis? No se aplica en este caso. Este medicamento no es para uso regular. Qu puede interactuar con este medicamento? Ciertos medicamentos para las infecciones  micticas, tales como fluconazol o itraconazol Rifampicina Puede ser que esta lista no menciona todas las posibles interacciones. Informe a su profesional de [employer] de [employer] Inc productos a base de hierbas, medicamentos de [place] de los [place] o suplementos nutritivos que est tomando. Si usted fuma, consume bebidas alcohlicas o si utiliza drogas ilegales, indqueselo tambin a su profesional de [employer]. Algunas sustancias pueden interactuar con su medicamento. A qu debo estar atento al usar [employer]? Visite a su equipo de atencin para que revise su evolucin peridicamente. Si los sntomas no comienzan a mejorar o si empeoran, informe a su equipo de atencin. Qu efectos secundarios puedo tener al [employer] este medicamento? Efectos secundarios que debe informar a su equipo de atencin tan pronto como sea posible: Reacciones alrgicas: erupcin cutnea, comezn/picazn, urticaria, hinchazn de la cara, los labios, la lengua o la garganta Efectos secundarios que generalmente no requieren atencin mdica (debe informarlos a su equipo de atencin si persisten o si son molestos): Nuseas Dolor estomacal Puede ser que esta lista no menciona todos los posibles efectos secundarios. Comunquese a su mdico por asesoramiento mdico [employer]. Usted puede informar los efectos secundarios a la FDA por telfono al 1-800-FDA-1088. Dnde debo guardar mi medicina? Mantenga fuera del alcance de nios y Neurosurgeon. Guarde a temperatura ambiente, entre 20 y 25 grados Celsius (68 y 65 grados Fahrenheit). Deseche todo el medicamento que no haya utilizado despus de la fecha de vencimiento. Para desechar los medicamentos que ya no necesite o que estn vencidos: Lleve el medicamento a un programa de recuperacin de medicamentos. Consulte con su farmacia o con una entidad reguladora para encontrar un lugar donde llevarlo. Si no puede devolver el medicamento, consulte la etiqueta o el folleto de  informacin para ver si debe desecharlo en la basura o arrojarlo por el sanitario. Si no est seguro, pregunte a su equipo de atencin. Si es seguro colocarlo en la basura, saque el medicamento del recipiente. Mezcle el medicamento con piedras sanitarias para gatos, tierra, posos (residuos) de caf u otro desperdicio. Coloque la mezcla en una bolsa o recipiente que quede [place]. Deseche en la basura. ATENCIN: Este folleto es un resumen. Puede ser que no cubra toda la posible informacin. Si usted tiene preguntas acerca de esta medicina, consulte con su mdico, su farmacutico o su profesional de Radiographer, therapeutic.  2025 Elsevier/Gold Standard (2021-08-19 00:00:00)  Nortriptyline  Capsules Qu es este medicamento? La NORTRIPTILINA trata la depresin. Aumenta la cantidad de serotonina y norepinefrina en el cerebro, sustancias que ayudan a regular el estado de nimo. Pertenece a un grupo de medicamentos llamados antidepresivos tricclicos. Este medicamento puede ser utilizado para otros usos; si tiene alguna pregunta consulte con su proveedor de atencin mdica o con su farmacutico. MARCAS COMUNES: Aventyl , Pamelor  Qu le debo informar a mi profesional de la salud antes de tomar este medicamento? Necesitan saber si usted presenta alguno de los siguientes problemas o situaciones: Trastorno bipolar sndrome de [place] Consume bebidas alcohlicas con frecuencia Glaucoma Afecciones cardiacas o vasculares Antecedentes de ataque cardiaco o accidente cerebrovascular Enfermedad heptica [place]renia Convulsiones Ideas, planes o intento de suicidio por parte suya o de alguien de su familia Enfermedad tiroidea Dificultad para Pharmacist, hospital reaccin alrgica o inusual a la nortriptilina, a otros medicamentos, alimentos, colorantes o conservantes Si est embarazada o buscando quedar embarazada Si est amamantando a un beb Cmo debo utilizar este medicamento? Tome este medicamento por va oral con un  vaso de agua. Siga las instrucciones en la etiqueta del medicamento. Administre sus dosis a intervalos regulares. No lo use con una frecuencia mayor a la indicada. No deje de usar [employer] de repente a menos que as lo indique su equipo de atencin. Dejar de Chemical engineer este medicamento demasiado rpido puede causar efectos secundarios graves o podra empeorar su afeccin. Su farmacutico le dar una Gua del medicamento especial (MedGuide, nombre en ingls) con cada receta y en cada ocasin que la vuelva a surtir. Asegrese de leer esta informacin cada vez cuidadosamente. Hable con su equipo de atencin sobre el uso de este medicamento en nios. Puede requerir atencin especial. Sobredosis: Pngase en contacto inmediatamente con un centro toxicolgico o una sala de urgencia si usted cree que haya tomado demasiado medicamento.<br>ATENCIN: [employer] es solo para usted. No comparta este medicamento con nadie. Qu sucede si me olvido de una dosis? Si olvida una dosis, tmela lo antes posible. Si es casi la hora de la prxima dosis, tome slo esa dosis. No tome dosis adicionales o dobles. Qu puede interactuar con este medicamento? No use este medicamento con ninguno de los siguientes productos: Cisaprida Dronedarona Linezolida IMAO, tales como Marplan, Nardil y Parnate Azul de metileno (inyectado en una vena) Pimozida Tioridazina Este medicamento tambin podra interactuar con los siguientes productos: Alcohol Antihistamnicos para Programmer, multimedia, tos y resfriado Atropina Buspirona Ciertos medicamentos para problemas de vejiga, tales como oxibutinina o tolterodina Ciertos medicamentos para la depresin, tales como amitriptilina, fluoxetina, sertralina Ciertos medicamentos para los dolores de cabeza, tales como sumatriptn o rizatriptn Ciertos medicamentos para el mal de Occupational hygienist, tales como benzatropina o trihexifenidilo Ciertos medicamentos para Dietitian, tales como  diciclomina o hiosciamina Ciertos medicamentos para el mareo por movimiento, como escopolamina Clorpropamida Cimetidina Fentanilo Ipratropio Litio Otros medicamentos que causan  cambios en el ritmo cardiaco, como dofetilida Quinidina Reserpina Hierba de [place] Medicamentos para la tiroides Tramadol Triptfano Puede ser que esta lista no menciona todas las posibles interacciones. Informe a su profesional de [employer] de [employer] Inc productos a base de hierbas, medicamentos de [place] o suplementos nutritivos que est tomando. Si usted fuma, consume bebidas alcohlicas o si utiliza drogas ilegales, indqueselo tambin a su profesional de [employer]. Algunas sustancias pueden interactuar con su medicamento. A qu debo estar atento al usar [employer]? Visite a su equipo de atencin para que revise su evolucin peridicamente. Es posible que pase un tiempo antes de que pueda notar los beneficios de [place]. Este medicamento podra causar ideas suicidas o depresin. Esto incluye cambios repentinos en el estado de nimo, comportamientos o pensamientos. Estos cambios pueden suceder en cualquier momento, pero son ms frecuentes durante el inicio del tratamiento o despus de un cambio en la dosis. Llame a su equipo de atencin de inmediato si tiene estos pensamientos o empeora su depresin. Este medicamento podra afectar su coordinacin, tiempo de reaccin o juicio. No conduzca ni opere maquinaria pesada hasta que sepa cmo le afecta este medicamento. Pngase de pie o levntese lentamente para reducir el riesgo de mareos o [place]. Beber alcohol con [employer] puede aumentar el riesgo de estos efectos secundarios. Se le podra secar la boca. Masticar chicle sin azcar o chupar caramelos duros y beber agua en abundancia podra ser de ayuda. Si el problema no desaparece o es grave, comunquese con su equipo de atencin. Este medicamento puede resecarle los ojos y provocar visin  borrosa. Si usa  lentes de contacto, podra sentir ciertas molestias. Las gotas lubricantes para los ojos pueden ser tiles. Si el problema no desaparece o es grave, visite a su equipo de atencin. Este medicamento causar estreimiento. Si no evacua los intestinos [place], llame a su equipo de atencin. Este medicamento puede aumentar su sensibilidad al sol. Evite la [employer]. Si no la Network engineer, utilice ropa protectora y crema de proteccin solar. No utilice lmparas solares, camas solares ni cabinas solares. Qu efectos secundarios puedo tener al [employer] este medicamento? Efectos secundarios que debe informar a su equipo de atencin tan pronto como sea posible: Reacciones alrgicas: erupcin cutnea, comezn/picazn, urticaria, hinchazn de la cara, los labios, la lengua o la garganta Cambios en el ritmo cardiaco: frecuencia cardiaca rpida o irregular, mareos, sensacin de desmayo o aturdimiento, dolor en el pecho, dificultad para respirar Irritabilidad, confusin, frecuencia cardiaca rpida o irregular, rigidez muscular, tics musculares, sudoracin, fiebre alta, convulsiones, escalofros, vmito, diarrea, que pueden ser signos del sndrome serotoninrgico Convulsiones Dolor repentino en los ojos o cambio en la visin como visin borrosa, ver halos alrededor Assurant, prdida de visin Ideas suicidas o de autolesionarse, empeoramiento del [place] de nimo, sentimientos de depresin Dificultad para orinar Efectos secundarios que generalmente no requieren atencin mdica (debe informarlos a su equipo de atencin si persisten o si son molestos): Cambios en el deseo o desempeo sexual Estreimiento Research scientist (life sciences) Somnolencia B[place] seca Temblores o sacudidas Puede ser que esta lista no menciona todos los posibles efectos secundarios. Comunquese a su mdico por asesoramiento mdico [employer]ard. Usted puede informar los efectos secundarios a la FDA por telfono al  1-800-FDA-1088. Dnde debo guardar mi medicina? Mantenga fuera del alcance de los nios. Guarde a [employer], entre 15 y 30 grados Celsius (59 y 60 grados Fahrenheit). Mantenga el recipiente bien cerrado. Deseche todo el medicamento que  no haya utilizado despus de la fecha de vencimiento. ATENCIN: Este folleto es un resumen. Puede ser que no cubra toda la posible informacin. Si usted tiene preguntas acerca de esta medicina, consulte con su mdico, su farmacutico o su profesional de Radiographer, therapeutic.  2024 Elsevier/Gold Standard (2022-08-16 00:00:00)

## 2023-12-03 ENCOUNTER — Other Ambulatory Visit: Payer: Self-pay | Admitting: Medical

## 2023-12-05 ENCOUNTER — Encounter: Payer: Self-pay | Admitting: Neurology

## 2023-12-05 DIAGNOSIS — G43009 Migraine without aura, not intractable, without status migrainosus: Secondary | ICD-10-CM | POA: Insufficient documentation

## 2023-12-05 MED ORDER — NURTEC 75 MG PO TBDP: 75.0000 mg | ORAL_TABLET | Freq: Every day | ORAL | 11 refills | Status: AC | PRN

## 2023-12-07 ENCOUNTER — Telehealth: Payer: Self-pay | Admitting: Pharmacist

## 2023-12-07 ENCOUNTER — Telehealth: Payer: Self-pay | Admitting: Neurology

## 2023-12-07 NOTE — Telephone Encounter (Signed)
 Pharmacy Patient Advocate Encounter   Received notification from Patient Pharmacy that prior authorization for Nurtec 75MG  dispersible tablets is required/requested.   Insurance verification completed.   The patient is insured through [facility] MEDICAID .   Per test claim: PA required; PA submitted to above mentioned insurance via Latent Key/confirmation #/EOC [id number] Status is pending

## 2023-12-07 NOTE — Telephone Encounter (Signed)
 [facility] shara: [id number] exp. 12/07/23-01/06/24 sent to GI [phone]

## 2023-12-08 ENCOUNTER — Telehealth: Payer: Self-pay | Admitting: Pharmacist

## 2023-12-08 NOTE — Telephone Encounter (Signed)
 Appeal has been submitted for Nurtec. Will advise when response is receive, please be advised that most companies may take 30 days to make a decision. Appeal letter and supporting documentation have been faxed to [phone] on 12/08/2023 @8 :42 am.  Thank you, Devere Pandy, PharmD Clinical Pharmacist  [place]  Direct Dial: [phone]

## 2023-12-08 NOTE — Telephone Encounter (Signed)
 Pharmacy Patient Advocate Encounter  Received notification from [facility] MEDICAID that Prior Authorization for Nurtec 75mg  ODT has been DENIED.  Full denial letter will be uploaded to the media tab. See denial reason below.   PA #/Case ID/Reference #: [id number]      These questions were not asked during the original PA, will submit an appeal for the patient and document in a separate telephone encounter.

## 2023-12-09 NOTE — Telephone Encounter (Signed)
 The appeal for Nurtec has been approved by [employer], full letter has been uploaded to media tab.   Thank you, Devere Pandy, PharmD Clinical Pharmacist  [place]  Direct Dial: [phone]

## 2023-12-12 NOTE — Telephone Encounter (Signed)
 I called the pharmacy and let them know. They will have pharmacist review and then fill for patient and notify her. I also sent the patient a mychart message.

## 2024-01-13 ENCOUNTER — Other Ambulatory Visit

## 2024-01-16 ENCOUNTER — Other Ambulatory Visit

## 2024-02-01 NOTE — Telephone Encounter (Signed)
 [facility] shara: [id number] exp. 02/01/2024-03/02/2024

## 2024-02-07 ENCOUNTER — Ambulatory Visit: Payer: Self-pay | Admitting: Neurology

## 2024-02-07 ENCOUNTER — Ambulatory Visit
Admission: RE | Admit: 2024-02-07 | Discharge: 2024-02-07 | Disposition: A | Discharge status: Home / Self Care | Source: Ambulatory Visit | Attending: Neurology | Admitting: Neurology

## 2024-02-07 DIAGNOSIS — R51 Headache with orthostatic component, not elsewhere classified: Secondary | ICD-10-CM

## 2024-02-07 DIAGNOSIS — R519 Headache, unspecified: Secondary | ICD-10-CM

## 2024-02-07 DIAGNOSIS — H539 Unspecified visual disturbance: Secondary | ICD-10-CM

## 2024-02-07 MED ORDER — GADOPICLENOL 0.5 MMOL/ML IV SOLN
7.0000 mL | Freq: Once | INTRAVENOUS | Status: AC | PRN
  Administered 2024-02-07: 7 mL via INTRAVENOUS

## 2024-02-14 ENCOUNTER — Other Ambulatory Visit: Payer: Self-pay | Admitting: Medical

## 2024-04-17 ENCOUNTER — Telehealth: Payer: Self-pay

## 2024-04-17 ENCOUNTER — Ambulatory Visit: Payer: Self-pay

## 2024-04-17 NOTE — Telephone Encounter (Signed)
 FYI Only or Action Required?: FYI only for provider: appointment scheduled on 04/27/24.  Patient was last seen in primary care on 10/14/2023 by Dorina Loving, PA-C.  Called Nurse Triage reporting Hypertension.  Symptoms began several weeks ago.  Interventions attempted: Dietary changes.  Symptoms are: unchanged.  Triage Disposition: See PCP Within 2 Weeks  Patient/caregiver understands and will follow disposition?: Yes  Reason for Disposition  [1] Systolic BP >= 130 OR Diastolic >= 80 AND [2] taking BP medications  Answer Assessment - Initial Assessment Questions Triage completed with interpreter services Alan ID# [phone].  Patient states that she woke up about 2 weeks ago with some swelling and tightness in her hands, she checked her blood pressure and it was 125/89. She states that about 3-4 days ago she had a ringing/discomfort in her ears (right more than left) and checked her BP again and it was 131/101. Her most recent BP reading is from this morning in which she got 125/95. She mentions feeling like her HR is higher and reports headaches, but does state that these are her baseline migraines. Office visit already scheduled for 04/27/24, no sooner appts available. Advised to call back if symptoms worsen.   1. BLOOD PRESSURE: What is your blood pressure? Did you take at least two measurements 5 minutes apart?     125/95  2. ONSET: When did you take your blood pressure?     This morning  3. HOW: How did you take your blood pressure? (e.g., automatic home BP monitor, visiting nurse)     At home  4. HISTORY: Do you have a history of high blood pressure?     No  5. MEDICINES: Are you taking any medicines for blood pressure? Have you missed any doses recently?     No  6. OTHER SYMPTOMS: Do you have any symptoms? (e.g., blurred vision, chest pain, difficulty breathing, headache, weakness)    Mentions waking up about 2 weeks ago with swelling and tightness in her  hands, also mentions feeling a ringing/discomfort in her ears (right more than left) 3-4 days ago, and she also mentions feeling like her heart is beating a little faster; Has had headaches but states these are her baseline migraines  7. PREGNANCY: Is there any chance you are pregnant? When was your last menstrual period?     Unknown  Protocols used: Blood Pressure - High-A-AH

## 2024-04-17 NOTE — Telephone Encounter (Signed)
" ° °  Interpreter needed.  ----- Message from [facility] sent at 04/17/2024  3:14 PM EST ----- Reason for Triage: patient stating she spoke to someone who told her to call in due to her blood pressure previously being 131/101- BP is currently at 125/95. She is c/o a bouncing sensation in her right ear and headaches.  [phone]   "

## 2024-04-17 NOTE — Telephone Encounter (Signed)
 Initial Comment Caller states has high BP for 2 wks; current reading is 125/95; not pregnant; headache aND ear ache; Spanish Interpreter [phone] Nidia Translation No Nurse Assessment Nurse: Kristi, RN, Windy Date/Time (Eastern Time): 04/17/2024 1:58:25 PM Confirm and document reason for call. If symptomatic, describe symptoms. ---Caller c/o HA (moderate pain now, nothing taken OTC yet), and right (more constant, and stronger) > left (mild intermittent) earache. S/S started with ear pain 2-3 days ago, and HA started this am. She has had high BP for 2 wks. Current reading is 125/95. Up to 131/101 last week. Denies any cold or cough. [Spanish speaking interpreter # [phone] Nicolas] Does the patient have any new or worsening symptoms? ---Yes Will a triage be completed? ---Yes Related visit to physician within the last 2 weeks? ---No Does the PT have any chronic conditions? (i.e. diabetes, asthma, this includes High risk factors for pregnancy, etc.) ---Yes List chronic conditions. ---migraines, HTN, hair loss, diabetic Is the patient pregnant or possibly pregnant? (Ask all females between the ages of 36-55) ---No Is this a behavioral health or substance abuse call? ---No Guidelines Guideline Title Affirmed Question Affirmed Notes Nurse Date/Time (Eastern Time) Blood Pressure - High Systolic BP >= 160 OR Diastolic >= 100 Elliott, RN, Trish 04/17/2024 2:03:50 PM PLEASE NOTE: All timestamps contained within this report are represented as Eastern Standard Time. CONFIDENTIALTY NOTICE: This fax transmission is intended only for the addressee. It contains information that is legally privileged, confidential or otherwise protected from use or disclosure. If you are not the intended recipient, you are strictly prohibited from reviewing, disclosing, copying using or disseminating any of this information or taking any action in reliance on or regarding this information. If you have received this fax  in error, please notify us  immediately by telephone so that we can arrange for its return to us . Phone: [phone], Toll-Free: [phone], Fax: [phone] FLOR_RAMIREZ [birth year] Page: 1 of3 CallId: [id number] Guidelines Guideline Title Affirmed Question Affirmed Notes Nurse Date/Time Titus Time) Headache [1] MODERATE headache (e.g., interferes with normal activities) AND [2] present > 24 hours AND [3] unexplained (Exceptions: Pain medicines not tried, typical migraine, or headache part of viral illness.) Kristi, RN, Trish 04/17/2024 2:12:20 PM Earache Diabetes mellitus or weak immune system (e.g., HIV positive, cancer chemo, splenectomy, organ transplant, chronic steroids) Kristi, RN, Trish 04/17/2024 2:18:39 PM Disp. Time Titus Time) Disposition Final User 04/17/2024 2:12:05 PM SEE PCP WITHIN 3 DAYS Kristi OBIE Trish 04/17/2024 2:18:27 PM See PCP within [address] Hours [place], [place], Trish 04/17/2024 2:22:11 PM See HCP (or PCP Triage) Within 4 Hours Yes Kristi, RN, Tpwib Final Disposition 04/17/2024 2:22:11 PM See HCP (or PCP Triage) Within 4 Hours Yes Kristi, RN, Trish Caller Disagree/Comply Comply Caller Understands Yes PreDisposition Did not know what to do Care Advice Given Per Guideline SEE PCP WITHIN 3 DAYS: * You need to be seen within 2 or 3 days. CALL BACK IF: * Your blood pressure is over 180/110 * You become worse CARE ADVICE given per Blood Pressure - High (Adult) guideline. PLEASE NOTE: All timestamps contained within this report are represented as Eastern Standard Time. CONFIDENTIALTY NOTICE: This fax transmission is intended only for the addressee. It contains information that is legally privileged, confidential or otherwise protected from use or disclosure. If you are not the intended recipient, you are strictly prohibited from reviewing, disclosing, copying using or disseminating any of this information or taking any action in reliance on or regarding  this information. If you have received this fax in  error, please notify us  immediately by telephone so that we can arrange for its return to us . Phone: [phone], Toll-Free: [phone], Fax: [phone] FLOR_RAMIREZ 11/30/1990 Page: 2 of3 CallId: [id number] Care Advice Given Per Guideline SEE PCP WITHIN 24 HOURS: * IF OFFICE WILL BE OPEN: You need to be examined within the next 24 hours. Call your doctor (or NP/PA) when the office opens and make an appointment. PAIN MEDICINES: * For pain relief, you can take either acetaminophen , ibuprofen , or naproxen. * Before taking any medicine, read all the instructions on the package. CALL BACK IF: * You become worse CARE ADVICE given per Headache (Adult) guideline. SEE HCP (OR PCP TRIAGE) WITHIN 4 HOURS: * IF OFFICE WILL BE OPEN: You need to be seen within the next 3 or 4 hours. Call your doctor (or NP/PA) now or as soon as the office opens. PAIN MEDICINES: * For pain relief, you can take either acetaminophen , ibuprofen , or naproxen. * Before taking any medicine, read all the instructions on the package. CALL BACK IF: * You become worse CARE ADVICE given per Earache (Adult) guideline. Comments User: Trish Constant, RN Date/Time (Eastern Time): 04/17/2024 2:10:32 PM HR jumps up to 130 sometimes, happened 2 days, and had SOB with this, not now. (HR noted on watch) User: Trish Constant, RN Date/Time Titus Time): 04/17/2024 2:23:49 PM unable to warm transfer to the office d/t system unable to transfer interpreter and pt and nurse. Pt will call office back. Referrals REFERRED TO PCP OFFICE

## 2024-04-20 ENCOUNTER — Ambulatory Visit: Admitting: Medical

## 2024-04-20 ENCOUNTER — Encounter: Payer: Self-pay | Admitting: Medical

## 2024-04-20 VITALS — BP 130/80 | HR 98 | Temp 98.6°F | Resp 16 | Ht 62.0 in | Wt 150.8 lb

## 2024-04-20 DIAGNOSIS — E119 Type 2 diabetes mellitus without complications: Secondary | ICD-10-CM

## 2024-04-20 DIAGNOSIS — Z7984 Long term (current) use of oral hypoglycemic drugs: Secondary | ICD-10-CM

## 2024-04-20 DIAGNOSIS — Z3009 Encounter for other general counseling and advice on contraception: Secondary | ICD-10-CM | POA: Diagnosis not present

## 2024-04-20 DIAGNOSIS — R030 Elevated blood-pressure reading, without diagnosis of hypertension: Secondary | ICD-10-CM | POA: Diagnosis not present

## 2024-04-20 LAB — POCT URINE PREGNANCY: Preg Test, Ur: NEGATIVE

## 2024-04-20 MED ORDER — DROSPIRENONE-ETHINYL ESTRADIOL 3-0.02 MG PO TABS: 1.0000 | ORAL_TABLET | Freq: Every day | ORAL | 0 refills | Status: AC

## 2024-04-20 NOTE — Progress Notes (Signed)
 "  Subjective:    Patient ID: Adriana Hahn, female    DOB: 06-21-1990, 34 y.o.   MRN: [id number]  HPI  Adriana Hahn is a 34 year old female who presents with elevated blood pressure and associated symptoms.  Over the past two weeks, she has had mild  elevated blood pressure readings, with a maximum of 136/95 mmHg about four to five days ago and a recent reading of 130/80 mmHg. She is not on antihypertensive medications. Her home blood pressure monitor is about 34 years old and has not been replaced since her last pregnancy.  2 other recent readings randomly between 140-150 systolic.  She has visual disturbances described as flashes or stars, particularly during stress when bp was mild elevated and right greater than left ear discomfort. No vision or ear pain during exam today.  She is using minoxidil  for hair loss. She is not currently taking contraceptive pills while awaiting a refill from the pharmacy.  She has diabetes treated with metformin . Recent fasting and postprandial blood glucose values were 108 mg/dL and 884 mg/dL. She limits salt and caffeine  and exercises regularly.     Lmp- 04-18-2023.  Review of Systems  Constitutional:  Negative for chills, fatigue and fever.  HENT:  Negative for congestion, ear pain, hearing loss and nosebleeds.        No current symptoms.  Eyes:  Negative for photophobia, redness, itching and visual disturbance.       No current symptoms.  Respiratory:  Negative for chest tightness, shortness of breath and wheezing.   Cardiovascular:  Negative for chest pain and palpitations.  Musculoskeletal:  Negative for back pain and neck pain.  Skin:  Negative for rash.  Neurological:  Negative for dizziness, speech difficulty, weakness and numbness.  Hematological:  Negative for adenopathy.  Psychiatric/Behavioral:  Negative for behavioral problems. The patient is not nervous/anxious and is not hyperactive.     Past Medical History:  Diagnosis Date    Gestational diabetes    Migraines    PCOS (polycystic ovarian syndrome)      Social History   Socioeconomic History   Marital status: Married    Spouse name: Idolina Guadeloupe   Number of children: 0   Years of education: 13   Highest education level: 8th grade  Occupational History   Occupation: unemployed  Tobacco Use   Smoking status: Never   Smokeless tobacco: Never  Vaping Use   Vaping status: Never Used  Substance and Sexual Activity   Alcohol use: Yes    Comment: occ   Drug use: No   Sexual activity: Yes    Partners: Male    Birth control/protection: None  Other Topics Concern   Not on file  Social History Narrative   Not on file   Social Drivers of Health   Tobacco Use: Low Risk (04/20/2024)   Patient History    Smoking Tobacco Use: Never    Smokeless Tobacco Use: Never    Passive Exposure: Not on file  Financial Resource Strain: Patient Declined (04/17/2024)   Overall Financial Resource Strain (CARDIA)    Difficulty of Paying Living Expenses: Patient declined  Food Insecurity: Patient Declined (04/17/2024)   Epic    Worried About Programme Researcher, Broadcasting/film/video in the Last Year: Patient declined    Barista in the Last Year: Patient declined  Transportation Needs: Patient Declined (04/17/2024)   Epic    Lack of Transportation (Medical): Patient declined    Lack of Transportation (  Non-Medical): Patient declined  Physical Activity: Unknown (04/17/2024)   Exercise Vital Sign    Days of Exercise per Week: Patient declined    Minutes of Exercise per Session: Not on file  Stress: Patient Declined (04/17/2024)   [employer] of Occupational Health - Occupational Stress Questionnaire    Feeling of Stress: Patient declined  Social Connections: Moderately Isolated (04/17/2024)   Social Connection and Isolation Panel    Frequency of Communication with Friends and Family: Twice a week    Frequency of Social Gatherings with Friends and Family: Once a week    Attends  Religious Services: Patient declined    Database Administrator or Organizations: No    Attends Engineer, Structural: Not on file    Marital Status: Married  Catering Manager Violence: Not on file  Depression (PHQ2-9): Low Risk (04/20/2024)   Depression (PHQ2-9)    PHQ-2 Score: 2  Alcohol Screen: Low Risk (09/26/2023)   Alcohol Screen    Last Alcohol Screening Score (AUDIT): 0  Housing: Unknown (04/17/2024)   Epic    Unable to Pay for Housing in the Last Year: No    Number of Times Moved in the Last Year: Not on file    Homeless in the Last Year: No  Utilities: Not on file  Health Literacy: Not on file    Past Surgical History:  Procedure Laterality Date   NO PAST SURGERIES      Family History  Problem Relation Age of Onset   Diabetes Mother     Allergies[1]  Medications Ordered Prior to Encounter[2]  BP 130/80   Pulse 98   Temp 98.6 F (37 C) (Oral)   Resp 16   Ht 5' 2 (1.575 m)   Wt 150 lb 12.8 oz (68.4 kg)   SpO2 98%   BMI 27.58 kg/m        Objective:   Physical Exam  General- No acute distress. Pleasant patient. Neck- Full range of motion, no jvd Lungs- Clear, even and unlabored. Heart- regular rate and rhythm. Neurologic- CNII- XII grossly intact.  Heent- normal canals and tms. Eyes-perrl bilaterally. Back- no cva pain.  Abd-soft, nt, nd and +bs.      Assessment & Plan:   Patient Instructions  Elevated blood pressure, situational Blood pressure elevated due to stress, with occasional transient visual disturbances and ear discomfort(none presently). Current reading 130/80 mmHg. Home monitor may be inaccurate. No antihypertensive medication prescribed.  - Check blood pressure daily at home. - Schedule nurse visit in one week to compare home and clinic readings. Bring your machin - Consider amlodipine 5 mg if blood pressure near 140/90 mmHg. - Advised reducing salt intake, caffeine , and stress. - Recommended purchasing a new blood pressure  monitor if current one is inaccurate.  Type 2 diabetes mellitus, well controlled Blood glucose well controlled with metformin . Recent reading: 108 mg/dL fasting. - Continue metformin . - Confirm control with A1c testing. and get cmp  Contraceptive management(family planning) . Refill pending due to pharmacy communication lapse. - generic yaz loryna  rx'd but only 1 month - Follow up with gynecologist for long-term contraceptive management.     Dallas Maxwell, PA-C     [1]  Allergies Allergen Reactions   Cherry Anaphylaxis   Almond (Diagnostic) Itching   Apple    Apple Juice Itching    Red apples cause this more than other varieties of apples   Black Kerr-mcgee (Non-Screening) Itching  [2]  Current Outpatient Medications on  File Prior to Visit  Medication Sig Dispense Refill   Accu-Chek Softclix Lancets lancets CHECK BLOOD SUGARS TWICE DAILY 100 each 0   Blood Glucose Monitoring Suppl (ACCU-CHEK GUIDE) w/Device KIT 1 kit by Does not apply route daily. 1 kit 2   glucose blood (ACCU-CHEK GUIDE TEST) test strip Check blood sugars twice daily 200 each 3   hydrocortisone  2.5 % ointment Apply to affected areas rash once to twice daily until improved. 30 g 3   ibuprofen  (ADVIL ) 600 MG tablet Take 1 tablet (600 mg total) by mouth every 6 (six) hours. 30 tablet 0   metFORMIN  (GLUCOPHAGE ) 500 MG tablet Take 1 tablet (500 mg total) by mouth 2 (two) times daily with a meal. 180 tablet 3   minoxidil  (LONITEN ) 2.5 MG tablet Take 1 tablet by mouth every day. 90 tablet 1   nortriptyline  (PAMELOR ) 25 MG capsule Take 1 capsule (25 mg total) by mouth at bedtime. 30 capsule 6   progesterone  (PROMETRIUM ) 100 MG capsule TAKE 1 CAPSULE BY MOUTH ONCE DAILY **TAKE NIGHTLY TO STOP BLEEDING THEN WHEN PERIOD STOPS, PLEASE BEGIN YAZ** 20 capsule 0   Rimegepant Sulfate (NURTEC) 75 MG TBDP Take 1 tablet (75 mg total) by mouth daily as needed. For migraines. Take as close to onset of migraine as  possible. One daily maximum. 16 tablet 11   No current facility-administered medications on file prior to visit.   "

## 2024-04-20 NOTE — Patient Instructions (Addendum)
 Elevated blood pressure, situational Blood pressure elevated due to stress, with occasional transient visual disturbances and ear discomfort(none presently). Current reading 130/80 mmHg. Home monitor may be inaccurate. No antihypertensive medication prescribed.  - Check blood pressure daily at home. - Schedule nurse visit in one week to compare home and clinic readings. Bring your machine - Consider amlodipine 5 mg if blood pressure near 140/90 mmHg. - Advised reducing salt intake, caffeine , and stress. - Recommended purchasing a new blood pressure monitor if current one is inaccurate.  Type 2 diabetes mellitus, well controlled Blood glucose well controlled with metformin . Recent reading: 108 mg/dL fasting. - Continue metformin . - Confirm control with A1c testing. and get cmp  Contraceptive management(family planning) . Refill pending due to pharmacy communication lapse. - generic yaz loryna  rx'd but only 1 month - Follow up with gynecologist for long-term contraceptive management.

## 2024-04-21 LAB — COMPREHENSIVE METABOLIC PANEL WITH GFR
AG Ratio: 2 (calc) (ref 1.0–2.5)
ALT: 43 U/L — ABNORMAL HIGH (ref 6–29)
AST: 32 U/L — ABNORMAL HIGH (ref 10–30)
Albumin: 4.5 g/dL (ref 3.6–5.1)
Alkaline phosphatase (APISO): 70 U/L (ref 31–125)
BUN: 15 mg/dL (ref 7–25)
CO2: 25 mmol/L (ref 20–32)
Calcium: 9 mg/dL (ref 8.6–10.2)
Chloride: 105 mmol/L (ref 98–110)
Creat: 0.93 mg/dL (ref 0.50–0.97)
Globulin: 2.3 g/dL (ref 1.9–3.7)
Glucose, Bld: 113 mg/dL — ABNORMAL HIGH (ref 65–99)
Potassium: 4.2 mmol/L (ref 3.5–5.3)
Sodium: 139 mmol/L (ref 135–146)
Total Bilirubin: 0.3 mg/dL (ref 0.2–1.2)
Total Protein: 6.8 g/dL (ref 6.1–8.1)
eGFR: 83 mL/min/1.73m2

## 2024-04-21 LAB — HEMOGLOBIN A1C
Hgb A1c MFr Bld: 6.4 % — ABNORMAL HIGH
Mean Plasma Glucose: 137 mg/dL
eAG (mmol/L): 7.6 mmol/L

## 2024-04-22 ENCOUNTER — Ambulatory Visit: Payer: Self-pay | Admitting: Medical

## 2024-04-23 ENCOUNTER — Ambulatory Visit: Payer: Medicaid Other | Admitting: Obstetrics and Gynecology

## 2024-04-23 ENCOUNTER — Telehealth: Payer: Self-pay

## 2024-04-23 NOTE — Telephone Encounter (Signed)
 For lab note 1/16 pt denies drinking alcohol when do you want to repeat the liver testing or do u want her to do everything in 3 months?

## 2024-04-24 NOTE — Telephone Encounter (Signed)
 Patient notified of this information and scheduled to return in 3 months

## 2024-04-26 ENCOUNTER — Ambulatory Visit: Admitting: Family Medicine

## 2024-04-26 VITALS — BP 138/86 | HR 99

## 2024-04-26 DIAGNOSIS — R030 Elevated blood-pressure reading, without diagnosis of hypertension: Secondary | ICD-10-CM | POA: Diagnosis not present

## 2024-04-26 NOTE — Progress Notes (Unsigned)
 Pt here for Blood pressure check per Dallas  Pt currently takes: nothing for BP   Pt reports compliance with medication.   BP today @ = 138/86  HR = 99   Home cuff- 127/87 HR: 93  Per Dr Domenica- normal BP. Follow-up as scheduled w/ Dallas.

## 2024-04-27 ENCOUNTER — Ambulatory Visit: Admitting: Medical

## 2024-04-30 ENCOUNTER — Ambulatory Visit: Admitting: Dermatology

## 2024-05-07 ENCOUNTER — Ambulatory Visit: Admitting: Obstetrics and Gynecology

## 2024-05-08 LAB — OPHTHALMOLOGY REPORT-SCANNED

## 2024-05-15 ENCOUNTER — Ambulatory Visit: Admitting: Dermatology

## 2024-05-22 ENCOUNTER — Ambulatory Visit: Admitting: Dermatology

## 2024-06-05 ENCOUNTER — Ambulatory Visit: Admitting: Obstetrics and Gynecology

## 2024-07-02 ENCOUNTER — Ambulatory Visit: Admitting: Family Medicine

## 2024-07-23 ENCOUNTER — Ambulatory Visit: Admitting: Medical
